# Patient Record
Sex: Female | Born: 1975 | Race: White | Hispanic: No | Marital: Married | Smoking: Former smoker
Health system: Southern US, Community
[De-identification: ages and names within clinical notes are randomized; demographics above are authoritative.]

## PROBLEM LIST (undated history)

## (undated) DIAGNOSIS — F909 Attention-deficit hyperactivity disorder, unspecified type: Secondary | ICD-10-CM

## (undated) DIAGNOSIS — T7840XA Allergy, unspecified, initial encounter: Secondary | ICD-10-CM

## (undated) DIAGNOSIS — G43909 Migraine, unspecified, not intractable, without status migrainosus: Secondary | ICD-10-CM

## (undated) DIAGNOSIS — N926 Irregular menstruation, unspecified: Secondary | ICD-10-CM

## (undated) DIAGNOSIS — K589 Irritable bowel syndrome without diarrhea: Secondary | ICD-10-CM

## (undated) DIAGNOSIS — I493 Ventricular premature depolarization: Secondary | ICD-10-CM

## (undated) HISTORY — PX: GYNECOLOGIC CRYOSURGERY: SHX857

## (undated) HISTORY — DX: Allergy, unspecified, initial encounter: T78.40XA

## (undated) HISTORY — DX: Irritable bowel syndrome, unspecified: K58.9

## (undated) HISTORY — DX: Migraine, unspecified, not intractable, without status migrainosus: G43.909

## (undated) HISTORY — PX: TUBAL LIGATION: SHX77

## (undated) HISTORY — DX: Irregular menstruation, unspecified: N92.6

## (undated) HISTORY — DX: Ventricular premature depolarization: I49.3

## (undated) HISTORY — PX: SPINE SURGERY: SHX786

## (undated) HISTORY — DX: Attention-deficit hyperactivity disorder, unspecified type: F90.9

---

## 1998-09-09 ENCOUNTER — Other Ambulatory Visit: Admission: RE | Admit: 1998-09-09 | Discharge: 1998-09-09 | Payer: Self-pay | Admitting: Obstetrics and Gynecology

## 2005-05-31 ENCOUNTER — Ambulatory Visit: Payer: Self-pay | Admitting: Family Medicine

## 2005-06-02 ENCOUNTER — Ambulatory Visit: Payer: Self-pay | Admitting: Oncology

## 2005-06-21 LAB — CBC WITH DIFFERENTIAL/PLATELET
BASO%: 0.7 % (ref 0.0–2.0)
Basophils Absolute: 0 10*3/uL (ref 0.0–0.1)
EOS%: 0.8 % (ref 0.0–7.0)
MCH: 31.4 pg (ref 26.0–34.0)
MCHC: 34.5 g/dL (ref 32.0–36.0)
MCV: 91.2 fL (ref 81.0–101.0)
MONO%: 5.6 % (ref 0.0–13.0)
RBC: 4.19 10*6/uL (ref 3.70–5.32)
RDW: 13.1 % (ref 11.3–14.5)

## 2005-06-21 LAB — MORPHOLOGY

## 2005-06-25 LAB — VON WILLEBRAND FACTOR MULTIMER
Ristocetin-Cofactor: 61 % (ref 50–150)
Von Willebrand Ag: 86 % normal (ref 60–150)
Von Willebrand Multimers: NORMAL

## 2005-06-25 LAB — ANA: Anti Nuclear Antibody(ANA): NEGATIVE

## 2005-08-15 ENCOUNTER — Ambulatory Visit: Payer: Self-pay | Admitting: Family Medicine

## 2005-09-16 ENCOUNTER — Ambulatory Visit: Payer: Self-pay | Admitting: Oncology

## 2005-09-22 ENCOUNTER — Ambulatory Visit: Payer: Self-pay | Admitting: Family Medicine

## 2005-09-28 LAB — VON WILLEBRAND PANEL
Factor-VIII Activity: 68 % — ABNORMAL LOW (ref 75–150)
Ristocetin-Cofactor: 72 % (ref 50–150)
Von Willebrand Ag: 89 % normal (ref 61–164)

## 2005-09-28 LAB — APTT: aPTT: 37 seconds (ref 24–37)

## 2005-11-15 ENCOUNTER — Ambulatory Visit: Payer: Self-pay | Admitting: Oncology

## 2006-06-20 ENCOUNTER — Ambulatory Visit: Payer: Self-pay | Admitting: Oncology

## 2006-06-20 LAB — IVY BLEEDING TIME: Bleeding Time: 5 Minutes (ref 2.0–8.0)

## 2006-06-24 LAB — VON WILLEBRAND FACTOR MULTIMER: Von Willebrand Multimers: NORMAL

## 2006-08-30 ENCOUNTER — Ambulatory Visit: Payer: Self-pay | Admitting: Oncology

## 2006-11-20 ENCOUNTER — Ambulatory Visit: Payer: Self-pay | Admitting: Family Medicine

## 2006-11-20 DIAGNOSIS — J019 Acute sinusitis, unspecified: Secondary | ICD-10-CM | POA: Insufficient documentation

## 2006-11-20 DIAGNOSIS — J45901 Unspecified asthma with (acute) exacerbation: Secondary | ICD-10-CM

## 2006-11-21 ENCOUNTER — Telehealth: Payer: Self-pay | Admitting: Family Medicine

## 2006-12-27 ENCOUNTER — Encounter: Payer: Self-pay | Admitting: Family Medicine

## 2007-03-22 DIAGNOSIS — J069 Acute upper respiratory infection, unspecified: Secondary | ICD-10-CM | POA: Insufficient documentation

## 2007-03-27 ENCOUNTER — Ambulatory Visit: Payer: Self-pay | Admitting: Family Medicine

## 2007-05-28 ENCOUNTER — Telehealth: Payer: Self-pay | Admitting: Internal Medicine

## 2007-09-03 ENCOUNTER — Ambulatory Visit: Payer: Self-pay | Admitting: Family Medicine

## 2007-09-03 DIAGNOSIS — N912 Amenorrhea, unspecified: Secondary | ICD-10-CM | POA: Insufficient documentation

## 2007-09-03 LAB — CONVERTED CEMR LAB: Beta hcg, urine, semiquantitative: NEGATIVE

## 2007-10-08 ENCOUNTER — Telehealth: Payer: Self-pay | Admitting: Family Medicine

## 2007-11-15 ENCOUNTER — Ambulatory Visit: Payer: Self-pay | Admitting: Family Medicine

## 2007-11-26 ENCOUNTER — Telehealth: Payer: Self-pay | Admitting: Family Medicine

## 2007-12-02 DIAGNOSIS — T7840XA Allergy, unspecified, initial encounter: Secondary | ICD-10-CM | POA: Insufficient documentation

## 2007-12-03 ENCOUNTER — Emergency Department (HOSPITAL_COMMUNITY): Admission: EM | Admit: 2007-12-03 | Discharge: 2007-12-03 | Payer: Self-pay | Admitting: Emergency Medicine

## 2007-12-03 ENCOUNTER — Telehealth: Payer: Self-pay | Admitting: Family Medicine

## 2007-12-03 ENCOUNTER — Ambulatory Visit: Payer: Self-pay | Admitting: Family Medicine

## 2007-12-04 ENCOUNTER — Telehealth: Payer: Self-pay | Admitting: Family Medicine

## 2007-12-06 ENCOUNTER — Telehealth: Payer: Self-pay | Admitting: Family Medicine

## 2007-12-29 ENCOUNTER — Encounter: Payer: Self-pay | Admitting: Cardiology

## 2008-03-04 ENCOUNTER — Encounter: Admission: RE | Admit: 2008-03-04 | Discharge: 2008-03-26 | Payer: Self-pay | Admitting: Obstetrics and Gynecology

## 2008-04-09 ENCOUNTER — Telehealth: Payer: Self-pay | Admitting: Family Medicine

## 2008-06-25 ENCOUNTER — Ambulatory Visit (HOSPITAL_COMMUNITY): Admission: RE | Admit: 2008-06-25 | Discharge: 2008-06-25 | Payer: Self-pay | Admitting: Obstetrics and Gynecology

## 2008-08-06 ENCOUNTER — Encounter: Payer: Self-pay | Admitting: Cardiology

## 2008-08-06 ENCOUNTER — Inpatient Hospital Stay (HOSPITAL_COMMUNITY): Admission: AD | Admit: 2008-08-06 | Discharge: 2008-08-06 | Payer: Self-pay | Admitting: Obstetrics & Gynecology

## 2008-08-06 DIAGNOSIS — I491 Atrial premature depolarization: Secondary | ICD-10-CM

## 2008-08-07 ENCOUNTER — Ambulatory Visit: Payer: Self-pay | Admitting: Family Medicine

## 2008-08-07 ENCOUNTER — Telehealth: Payer: Self-pay | Admitting: *Deleted

## 2008-08-12 ENCOUNTER — Ambulatory Visit: Payer: Self-pay | Admitting: Cardiology

## 2008-08-12 DIAGNOSIS — R002 Palpitations: Secondary | ICD-10-CM

## 2008-08-14 ENCOUNTER — Ambulatory Visit: Payer: Self-pay | Admitting: Cardiology

## 2008-08-14 ENCOUNTER — Encounter: Payer: Self-pay | Admitting: Cardiology

## 2008-08-14 ENCOUNTER — Ambulatory Visit: Payer: Self-pay

## 2008-08-20 ENCOUNTER — Telehealth: Payer: Self-pay | Admitting: Cardiology

## 2008-09-14 ENCOUNTER — Inpatient Hospital Stay (HOSPITAL_COMMUNITY): Admission: AD | Admit: 2008-09-14 | Discharge: 2008-09-16 | Payer: Self-pay | Admitting: Obstetrics and Gynecology

## 2008-11-11 ENCOUNTER — Ambulatory Visit (HOSPITAL_COMMUNITY): Admission: RE | Admit: 2008-11-11 | Discharge: 2008-11-11 | Payer: Self-pay | Admitting: Obstetrics and Gynecology

## 2008-11-11 ENCOUNTER — Encounter (INDEPENDENT_AMBULATORY_CARE_PROVIDER_SITE_OTHER): Payer: Self-pay | Admitting: Obstetrics and Gynecology

## 2008-12-29 ENCOUNTER — Telehealth: Payer: Self-pay | Admitting: Family Medicine

## 2009-01-22 ENCOUNTER — Ambulatory Visit (HOSPITAL_COMMUNITY): Admission: RE | Admit: 2009-01-22 | Discharge: 2009-01-22 | Payer: Self-pay | Admitting: Obstetrics and Gynecology

## 2009-02-02 ENCOUNTER — Telehealth: Payer: Self-pay | Admitting: Family Medicine

## 2009-02-07 ENCOUNTER — Telehealth: Payer: Self-pay | Admitting: Family Medicine

## 2009-02-09 ENCOUNTER — Ambulatory Visit: Payer: Self-pay | Admitting: Family Medicine

## 2009-02-09 LAB — CONVERTED CEMR LAB
AST: 24 units/L (ref 0–37)
Albumin: 3.7 g/dL (ref 3.5–5.2)
Alkaline Phosphatase: 56 units/L (ref 39–117)
Basophils Absolute: 0 10*3/uL (ref 0.0–0.1)
Basophils Relative: 0.7 % (ref 0.0–3.0)
Calcium: 8.9 mg/dL (ref 8.4–10.5)
Creatinine, Ser: 0.8 mg/dL (ref 0.4–1.2)
Eosinophils Absolute: 0 10*3/uL (ref 0.0–0.7)
Eosinophils Relative: 0.7 % (ref 0.0–5.0)
GFR calc non Af Amer: 87.62 mL/min (ref >60)
Glucose, Bld: 91 mg/dL (ref 70–99)
HCT: 39.1 % (ref 36.0–46.0)
HDL: 39.2 mg/dL (ref >39.00)
Leukocytes, UA: NEGATIVE
Lymphs Abs: 2.1 10*3/uL (ref 0.7–4.0)
MCV: 92.1 fL (ref 78.0–100.0)
Monocytes Relative: 9.9 % (ref 3.0–12.0)
Nitrite: NEGATIVE
Platelets: 254 10*3/uL (ref 150.0–400.0)
Total Bilirubin: 0.5 mg/dL (ref 0.3–1.2)
Total CHOL/HDL Ratio: 3
Total Protein: 7.1 g/dL (ref 6.0–8.3)
Triglycerides: 52 mg/dL (ref 0.0–149.0)
Urine Glucose: NEGATIVE mg/dL
Urobilinogen, UA: 0.2 (ref 0.0–1.0)
VLDL: 10.4 mg/dL (ref 0.0–40.0)

## 2009-02-10 ENCOUNTER — Ambulatory Visit: Payer: Self-pay | Admitting: Family Medicine

## 2009-02-10 DIAGNOSIS — R1011 Right upper quadrant pain: Secondary | ICD-10-CM

## 2009-02-11 ENCOUNTER — Encounter: Admission: RE | Admit: 2009-02-11 | Discharge: 2009-02-11 | Payer: Self-pay | Admitting: Family Medicine

## 2009-02-12 ENCOUNTER — Telehealth: Payer: Self-pay | Admitting: Family Medicine

## 2009-03-02 ENCOUNTER — Telehealth: Payer: Self-pay | Admitting: Family Medicine

## 2009-03-09 ENCOUNTER — Encounter (INDEPENDENT_AMBULATORY_CARE_PROVIDER_SITE_OTHER): Payer: Self-pay | Admitting: *Deleted

## 2009-04-14 ENCOUNTER — Telehealth: Payer: Self-pay | Admitting: Cardiology

## 2009-04-16 DIAGNOSIS — D680 Von Willebrand's disease: Secondary | ICD-10-CM | POA: Insufficient documentation

## 2009-04-22 ENCOUNTER — Ambulatory Visit: Payer: Self-pay | Admitting: Internal Medicine

## 2009-04-22 DIAGNOSIS — R109 Unspecified abdominal pain: Secondary | ICD-10-CM | POA: Insufficient documentation

## 2009-05-06 ENCOUNTER — Ambulatory Visit: Payer: Self-pay | Admitting: Internal Medicine

## 2009-05-07 ENCOUNTER — Encounter: Payer: Self-pay | Admitting: Internal Medicine

## 2009-05-08 ENCOUNTER — Telehealth: Payer: Self-pay | Admitting: Internal Medicine

## 2009-05-22 ENCOUNTER — Telehealth: Payer: Self-pay | Admitting: Internal Medicine

## 2009-11-26 ENCOUNTER — Encounter: Payer: Self-pay | Admitting: Family Medicine

## 2010-02-28 ENCOUNTER — Encounter: Payer: Self-pay | Admitting: Family Medicine

## 2010-03-11 NOTE — Progress Notes (Signed)
Summary: Hida Scan  Phone Note Call from Patient   Summary of Call: patient is calling because she spoke with the radiologist at [facility] and the HIDA scan will not work for her because she is breastfeeding.  Suggestions?  her number is [phone] Initial call taken by: Kern Reap CMA Duncan Dull),  February 12, 2009 11:00 AM  Follow-up for Phone Call        B. HIDA scan, the cannot be done because she is breast-feeding.  She wants to continue nursing.  Her ultrasound was negative and it appears symptoms have gone, since she's been on a fat-free diet.  Discussed options, general surgery consult, versus diet and waiting, and watchful follow-up.  Patient likes to watch and wait stand and died recommended.  Follow-up in one month, sooner if any problems Follow-up by: Roderick Pee MD,  February 12, 2009 11:41 AM

## 2010-03-11 NOTE — Miscellaneous (Signed)
Summary: Levsin Rx  Clinical Lists Changes  Medications: Added new medication of LEVSIN/SL 0.125 MG SUBL (HYOSCYAMINE SULFATE) Take 1 tablet under tongue before meals and at bedtime - Signed Rx of LEVSIN/SL 0.125 MG SUBL (HYOSCYAMINE SULFATE) Take 1 tablet under tongue before meals and at bedtime;  #90 x 1;  Signed;  Entered by: Jennye Boroughs RN;  Authorized by: Hart Carwin MD;  Method used: Electronically to [facility]*, [address] [place], [place]  [address], Ph: [phone] or [phone], Fax: [phone]    Prescriptions: LEVSIN/SL 0.125 MG SUBL (HYOSCYAMINE SULFATE) Take 1 tablet under tongue before meals and at bedtime  #90 x 1   Entered by:   Jennye Boroughs RN   Authorized by:   Hart Carwin MD   Signed by:   Jennye Boroughs RN on 05/06/2009   Method used:   Electronically to        [facility]* (retail)       [address] [place], [place]  [address]       Ph: [phone] or [phone]       Fax: [phone]   RxID:   [id number]

## 2010-03-11 NOTE — Assessment & Plan Note (Signed)
Summary: CPX // RS   Vital Signs:  Patient profile:   35 year old female Height:      64 inches Weight:      149 pounds Temp:     98.3 degrees F oral Pulse rate:   68 / minute Pulse rhythm:   regular BP sitting:   110 / 70  (left arm) Cuff size:   regular  Vitals Entered By: Kern Reap CMA Duncan Dull) (February 10, 2009 3:23 PM)  Reason for Visit cpx  Primary Care Provider:  Dr. Tawanna Cooler   History of Present Illness: Toni Frank is a 35 year old, married female, nonsmoker.  G3, P55, with a 57-week-old baby who comes in today for evaluation of abdominal pain.  Postpartum she began having some PC nausea with fatty foods that would last for an hour two and then went away.  Over the Christmas holiday.  She began having nausea, PC, but also severe midepigastric abdominal pain.  Her worst episode was about 10 days ago.  That episode lasted for about 3 to 4 hours.  Review of systems otherwise negative.  She had her tubes tied.  Also has an irregular heart rate every now and then.  Also, complaining of some postpartum hair loss.  Allergies: 1)  ! Sulfa  Past History:  Past medical, surgical, family and social histories (including risk factors) reviewed, and no changes noted (except as noted below).  Past Medical History: Reviewed history from 08/11/2008 and no changes required. PREMATURE ATRIAL CONTRACTIONS (ICD-427.61) ALLERGIC REACTION, ACUTE (ICD-995.3)allergic reaction to cats AMENORRHEA (ICD-626.0) VIRAL URI (ICD-465.9) SINUSITIS, ACUTE NOS (ICD-461.9) ASTHMA NOS W/ACUTE EXACERBATION (ICD-493.92) childbirth x 2 ADD   Family History: Reviewed history from 08/12/2008 and no changes required. No hx of SCD  Social History: Reviewed history from 03/27/2007 and no changes required. Occupation: Married Never Smoked Alcohol use-no Drug use-no Regular exercise-yes  Review of Systems      See HPI  Physical Exam  General:  Well-developed,well-nourished,in no acute distress;  alert,appropriate and cooperative throughout examination Head:  Normocephalic and atraumatic without obvious abnormalities. No apparent alopecia or balding. Eyes:  No corneal or conjunctival inflammation noted. EOMI. Perrla. Funduscopic exam benign, without hemorrhages, exudates or papilledema. Vision grossly normal. Ears:  External ear exam shows no significant lesions or deformities.  Otoscopic examination reveals clear canals, tympanic membranes are intact bilaterally without bulging, retraction, inflammation or discharge. Hearing is grossly normal bilaterally. Nose:  External nasal examination shows no deformity or inflammation. Nasal mucosa are pink and moist without lesions or exudates. Mouth:  Oral mucosa and oropharynx without lesions or exudates.  Teeth in good repair. Neck:  No deformities, masses, or tenderness noted. Chest Wall:  No deformities, masses, or tenderness noted. Breasts:  the breast exam is normal except for a 6-mm.  Times 6-mm lesion, 1 inch from the left nipple at the two o'clock position.  It appears to be a subcutaneous cyst Lungs:  Normal respiratory effort, chest expands symmetrically. Lungs are clear to auscultation, no crackles or wheezes. Heart:  Normal rate and regular rhythm. S1 and S2 normal without gallop, murmur, click, rub or other extra sounds. Abdomen:  Bowel sounds positive,abdomen soft and non-tender without masses, organomegaly or hernias noted. Msk:  No deformity or scoliosis noted of thoracic or lumbar spine.   Pulses:  R and L carotid,radial,femoral,dorsalis pedis and posterior tibial pulses are full and equal bilaterally Extremities:  No clubbing, cyanosis, edema, or deformity noted with normal full range of motion of all joints.  Neurologic:  No cranial nerve deficits noted. Station and gait are normal. Plantar reflexes are down-going bilaterally. DTRs are symmetrical throughout. Sensory, motor and coordinative functions appear intact. Skin:  total  body skin exam normal Cervical Nodes:  No lymphadenopathy noted Axillary Nodes:  No palpable lymphadenopathy Inguinal Nodes:  No significant adenopathy Psych:  Cognition and judgment appear intact. Alert and cooperative with normal attention span and concentration. No apparent delusions, illusions, hallucinations   Impression & Recommendations:  Problem # 1:  PALPITATIONS (ICD-785.1) Assessment Improved  Problem # 2:  ABDOMINAL PAIN RIGHT UPPER QUADRANT (ICD-789.01) Assessment: New  Orders: Radiology Referral (Radiology)  Complete Medication List: 1)  Epipen 2-pak 0.3 Mg/0.66ml (1:1000) Devi (Epinephrine hcl (anaphylaxis)) .... Uad 2)  Prenatal Vitamins  .Marland Kitchen.. 1 tab once daily  Patient Instructions: 1)  stay on a complete fat-free diet. 2)  Return two days after your ultrasound for follow-up. 3)  If in the meantime, he developed severe abdominal pain, fever, etc. go directly to the emergency room.  Check the lesion  on your left breast weekly return if that does not resolve in 4 to 6 weeks   Immunization History:  Influenza Immunization History:    Influenza:  historical (11/07/2008)

## 2010-03-11 NOTE — Letter (Signed)
Summary: Patient [facility] Biopsy Results  [facility]  [address] [place], [place] [address]   Phone: [phone]  Fax: [phone]        May 07, 2009 MRN: [id number]    [facility] [address] [place], [place]  [address]    Dear Ms. Rembert,  I am pleased to inform you that the biopsies taken during your recent endoscopic examination did not show any evidence of cancer upon pathologic examination.The biopsy from Your stomach shows normal tissue  Additional information/recommendations:  __No further action is needed at this time.  Please follow-up with      your primary care physician for your other healthcare needs.  __ Please call [phone] to schedule a return visit to review      your condition.  _x_ Continue with the treatment plan as outlined on the day of your      exam.     Please call us if you are having persistent problems or have questions about your condition that have not been fully answered at this time.  Sincerely,  Hart Carwin MD  This letter has been electronically signed by your physician.  Appended Document: Patient Notice-Endo Biopsy Results Letter mailed 4.1.11

## 2010-03-11 NOTE — Miscellaneous (Signed)
Summary: flu vaccine  Clinical Lists Changes  Observations: Added new observation of FLU VAX: Historical (11/24/2009 7:56)      Immunization History:  Influenza Immunization History:    Influenza:  historical (11/24/2009)

## 2010-03-11 NOTE — Letter (Signed)
Summary: EGD Instructions  [facility]  [address] [place], [place] [address]   Phone: [phone]  Fax: [phone]       Toni Frank    [birth year]    MRN: [id number]       Procedure Day /Date: 05/06/09 Wednesday     Arrival Time: 7:30 am     Procedure Time: 8:30 am     Location of Procedure:                    _ x _ [facility] (4th Floor)  PREPARATION FOR ENDOSCOPY   On 05/06/09 THE DAY OF THE PROCEDURE:  1.   No solid foods, milk or milk products are allowed after midnight the night before your procedure.  2.   Do not drink anything colored red or purple.  Avoid juices with pulp.  No orange juice.  3.  You may drink clear liquids until 6:30 am, which is 2 hours before your procedure.                                                                                                CLEAR LIQUIDS INCLUDE: Water Jello Ice Popsicles Tea (sugar ok, no milk/cream) Powdered fruit flavored drinks Coffee (sugar ok, no milk/cream) Gatorade Juice: apple, white grape, white cranberry  Lemonade Clear bullion, consomm, broth Carbonated beverages (any kind) Strained chicken noodle soup Hard Candy   MEDICATION INSTRUCTIONS Unless otherwise instructed, you should take regular prescription medications with a small sip of water as early as possible the morning of your procedure.                  OTHER INSTRUCTIONS  You will need a responsible adult at least 35 years of age to accompany you and drive you home.   This person must remain in the waiting room during your procedure.  Wear loose fitting clothing that is easily removed.  Leave jewelry and other valuables at home.  However, you may wish to bring a book to read or an iPod/MP3 player to listen to music as you wait for your procedure to start.  Remove all body piercing jewelry and leave at home.  Total time from sign-in until discharge is approximately 2-3 hours.  You should  go home directly after your procedure and rest.  You can resume normal activities the day after your procedure.  The day of your procedure you should not:   Drive   Make legal decisions   Operate machinery   Drink alcohol   Return to work  You will receive specific instructions about eating, activities and medications before you leave.    The above instructions have been reviewed and explained to me by [name] CMA Duncan Dull)  April 22, 2009 11:26 AM     I fully understand and can verbalize these instructions _____________________________ Date 04/22/09

## 2010-03-11 NOTE — Assessment & Plan Note (Signed)
Summary: abdominal pain--ch.   History of Present Illness Visit Type: Initial Visit Primary GI MD: Lina Sar MD Primary Provider: Dr. Tawanna Cooler Chief Complaint: Abdominal pain for 15 -20 minutes after meals x4 months History of Present Illness:   This is a 35 year old white female with subxiphoid and epigastric pain in the midline which started within 6 weeks after delivering her third child. She had no abdominal pain during pregnancy or with her 2 previous pregnancies. Her  abdominal ultrasound is negative. A HIDA scan could not be done because she is nursing. She delivered 7 months ago and the pain has been there intermittently for several months, usually starting after supper and subsiding by the time she goes to bed. There is no dysphagia. She denies taking any aspirin or anti-inflammatory agents. She does not drink alcohol. Her weight is 20 pounds below her prepregnancy weight.   GI Review of Systems    Reports abdominal pain, nausea, and  weight loss.     Location of  Abdominal pain: epigastric area. Weight loss of 20 pounds over 7 months.      Denies anal fissure, black tarry stools, change in bowel habit, constipation, diarrhea, diverticulosis, fecal incontinence, heme positive stool, hemorrhoids, irritable bowel syndrome, jaundice, light color stool, liver problems, rectal bleeding, and  rectal pain.    Current Medications (verified): 1)  Epipen 2-Pak 0.3 Mg/0.72ml (1:1000) Devi (Epinephrine Hcl (Anaphylaxis)) .... Uad 2)  Prenatal Vitamins .Marland Kitchen.. 1 Tab Once Daily  Allergies (verified): 1)  ! Sulfa  Past History:  Past Medical History: PREMATURE ATRIAL CONTRACTIONS (ICD-427.61) ALLERGIC REACTION, ACUTE (ICD-995.3)allergic reaction to cats AMENORRHEA (ICD-626.0) VIRAL URI (ICD-465.9) SINUSITIS, ACUTE NOS (ICD-461.9) ASTHMA NOS W/ACUTE EXACERBATION (ICD-493.92) childbirth x 3 ADD  Irritable Bowel Syndrome Kidney Stones  Past Surgical History: Tubal Ligation Suction,  dilatation and evacuation  Wisdom Teeth Extraction cryosurgery  Family History: Reviewed history from 04/16/2009 and no changes required. 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   Review of Systems       The patient complains of heart rhythm changes.  The patient denies allergy/sinus, anemia, anxiety-new, arthritis/joint pain, back pain, blood in urine, breast changes/lumps, change in vision, confusion, cough, coughing up blood, depression-new, fainting, fatigue, fever, headaches-new, hearing problems, heart murmur, itching, menstrual pain, muscle pains/cramps, night sweats, nosebleeds, pregnancy symptoms, shortness of breath, skin rash, sleeping problems, sore throat, swelling of feet/legs, swollen lymph glands, thirst - excessive , urination - excessive , urination changes/pain, urine leakage, vision changes, and voice change.         Pertinent positive and negative review of systems were noted in the above HPI. All other ROS was otherwise negative.   Vital Signs:  Patient profile:   35 year old female Height:      64 inches Weight:      145.13 pounds BMI:     25.00 Pulse rate:   84 / minute Pulse rhythm:   regular BP sitting:   108 / 76  (right arm) Cuff size:   regular  Vitals Entered By: June McMurray CMA Duncan Dull) (April 22, 2009 10:58 AM)  Physical Exam  General:  Well developed, well nourished, no acute distress. Eyes:  PERRLA, no icterus. Mouth:  No deformity or lesions, dentition normal. Neck:  Supple; no  masses or thyromegaly. Lungs:  Clear throughout to auscultation. Heart:  Regular rate and rhythm; no murmurs, rubs,  or bruits. Abdomen:  soft abdomen with  minimal tenderness in subxiphoid area and midline. Normoactive bowel sounds. No distention. Right upper quadrant unremarkable. Liver edge at costal margin. Extremities:  No clubbing, cyanosis, edema or deformities noted. Skin:  Intact without significant lesions or rashes. Psych:  Alert and cooperative. Normal mood and affect.   Impression & Recommendations:  Problem # 1:  ABDOMINAL PAIN RIGHT UPPER QUADRANT (ICD-789.01) Patient initially had right upper quadrant abdominal pain but now has epigastric and subxiphoid pain. She had a negative abdominal ultrasound. Her symptoms at this point are more consistent with gastritis or possible hiatal hernia. This is clearly related to food. We need to rule out peptic ulcer disease or H. pylori. The pain is intermittent and is suggestive of pancreatitis. She is otherwise healthy. She is still nursing her 4 month-old child and is planning on continuing to nurse for several more months. It would be safe for her to take her ranitidine 300 mg a day. At the same time, we will proceed with an upper endoscopy to rule out the above conditions.  Other Orders: EGD (EGD)  Patient Instructions: 1)  ranitidine 300 mg p.o. q.a.m. 2)  Upper endoscopy. 3)  She will pump prior to upper endoscopy and discard the milk after  the sedation. She will be able to nurse within 12 hours of the procedure. 4)  Copy sent to : Dr Shela Commons.Todd 5)  The medication list was reviewed and reconciled.  All changed / newly prescribed medications were explained.  A complete medication list was provided to the patient / caregiver. Prescriptions: RANITIDINE HCL 300 MG CAPS (RANITIDINE HCL) Take 1 tablet by mouth every morning  #30 x 1   Entered by:   [name] CMA (AAMA)   Authorized by:   Hart Carwin MD   Signed by:   [name] CMA (AAMA) on 04/22/2009   Method used:   Electronically to        [id number] (retail)       [address]       [place], [place]  [address]       Ph: [phone] or [phone]       Fax: [phone]   RxID:   [id number]

## 2010-03-11 NOTE — Progress Notes (Signed)
Summary: RX for abdominal pain?  Phone Note Outgoing Call   Caller: Patient Call For: Roderick Pee MD Complaint: Abdominal Pain Summary of Call: [phone] Pt wants Dr. Tawanna Cooler to know she believes her abdomen pain comes from certain foods she eats, and would like to try RX over the counter since she is still breast feeding if he would offer any suggestion.  [facility]  Initial call taken by: Lynann Beaver CMA,  March 02, 2009 2:19 PM Summary of Call: through trial and error Alexia Freestone has discovered that tomato sauce triggers her symptoms.  If she avoids anything with tomatoes   She is asymptomatic.  Advised to avoid tomatoes, and GI consult if symptoms persist Initial call taken by: Roderick Pee MD,  March 02, 2009 3:38 PM

## 2010-03-11 NOTE — Progress Notes (Signed)
Summary: irregular hr/dizzy  Phone Note Call from Patient Call back at Home Phone [phone] Call back at [phone]   Caller: Patient Reason for Call: Talk to Nurse Complaint: Chest Pain Summary of Call: before bed last night pt had irregular hr, woke up @ about 3 and she was very dizzy, still having irregular hr Initial call taken by: Migdalia Dk,  April 14, 2009 8:37 AM  Follow-up for Phone Call        Pt calling back has not heard from nurse. still having irregular heartrate Jamie Little  April 14, 2009 2:29 PM  [facility]  Scherrie Bateman, LPN  April 15, 3242 3:14 PM  Additional Follow-up for Phone Call Additional follow up Details #1::        PT RETURNING CALL.sign Additional Follow-up by: Judie Grieve,  April 14, 2009 4:14 PM    Additional Follow-up for Phone Call Additional follow up Details #2::    Reassurance that these are benign as we have told her before. Normal Echo, and reluctance to prescribe medicine with her breat feeding. Follow-up by: Gaylord Shih, MD, [facility],  April 15, 2009 10:21 AM   Appended Document: irregular hr/dizzy LMTCB./CY  Appended Document: irregular hr/dizzy PT AWARE./CY

## 2010-03-11 NOTE — Progress Notes (Signed)
Summary: TRIAGE-Side effect?  Phone Note Call from Patient Call back at Home Phone [phone] Call back at for next 1 hr [phone] cell   Call For: Dr Juanda Chance Reason for Call: Talk to Nurse Summary of Call: Thinks she has a side effect to a medicine the Dr prescribed. Initial call taken by: Leanor Kail [facility],  May 08, 2009 1:54 PM  Follow-up for Phone Call        Began Levsin Sl, c/o very dry mouth, is worried it will dry up her breast milk. Per Amy Esterwood PAC, it won't cause dehydration, suggests pt increase her fluids when she takes it. Pt. instructed to call back as needed.  Follow-up by: Laureen Ochs LPN,  May 08, 2009 3:04 PM

## 2010-03-11 NOTE — Progress Notes (Signed)
Summary: CONDITION UPDATE  Phone Note Call from Patient Call back at Home Phone [phone]   Caller: Patient Call For: Dr. Juanda Chance Reason for Call: Talk to Nurse Summary of Call: UPDATE Initial call taken by: Vallarie Mare,  May 22, 2009 11:35 AM  Follow-up for Phone Call        Last OV 04-22-09, Endo. 05-06-09.  Pt. takes Ranitadine 300mg  QAM and Levsin SL AC& HS. She has avoided foods that caused the epigastric pain.  Recently she began to attempt to add the foods back into her diet, but the pain resumes when she does.   DR.Tanganyika Bowlds PLEASE ADVISE Follow-up by: Laureen Ochs LPN,  May 22, 2009 12:29 PM  Additional Follow-up for Phone Call Additional follow up Details #1::        wait till she stops nursing, then do HIDA scan with [facility]. Also change from LevsinSL.125 mg to Bentyl 20 mg ac, #60, 1 refill. Additional Follow-up by: Hart Carwin MD,  May 23, 2009 2:00 PM    Additional Follow-up for Phone Call Additional follow up Details #2::    Message left for patient to callback. Laureen Ochs LPN  May 25, 2009 10:18 AM  Above MD orders reviewed with patient. Med to her pharmacy. She will callback to scheudle the HIDA/CCK after she stops nursing. Pt. instructed to call back as needed.  Follow-up by: Laureen Ochs LPN,  May 25, 2009 3:40 PM  New/Updated Medications: BENTYL 10 MG  CAPS (DICYCLOMINE HCL) Take 3 times daily, before meals. Prescriptions: BENTYL 10 MG  CAPS (DICYCLOMINE HCL) Take 3 times daily, before meals.  #60 x 1   Entered by:   Laureen Ochs LPN   Authorized by:   Hart Carwin MD   Signed by:   Laureen Ochs LPN on 30/86/5784   Method used:   Electronically to        [facility]* (retail)       [address] [place], [place]  [address]       Ph: [phone] or [phone]       Fax: [phone]   RxID:   [id number] RANITIDINE HCL 300 MG CAPS (RANITIDINE HCL) Take 1 tablet  by mouth every morning  #30 x 12   Entered by:   Laureen Ochs LPN   Authorized by:   Hart Carwin MD   Signed by:   Laureen Ochs LPN on 16/60/6301   Method used:   Electronically to        [facility]* (retail)       [address] [place], [place]  [address]       Ph: [phone] or [phone]       Fax: [phone]   RxID:   [id number]

## 2010-03-11 NOTE — Letter (Signed)
Summary: New Patient letter  [facility]  [address] [place], [place] [address]   Phone: [phone]  Fax: [phone]       03/09/2009 MRN: [id number]  [facility] [address] [place], [place]  [address]  Dear Ms. Jean Rosenthal,  Welcome to the Gastroenterology Division at [facility].    You are scheduled to see Dr. Juanda Chance on 04-22-09 at 10:30a.m. on the 3rd floor at [facility], [address]  We ask that you try to arrive at our office 15 minutes prior to your appointment time to allow for check-in.  We would like you to complete the enclosed self-administered evaluation form prior to your visit and bring it with you on the day of your appointment.  We will review it with you.  Also, please bring a complete list of all your medications or, if you prefer, bring the medication bottles and we will list them.  Please bring your insurance card so that we may make a copy of it.  If your insurance requires a referral to see a specialist, please bring your referral form from your primary care physician.  Co-payments are due at the time of your visit and may be paid by cash, check or credit card.     Your office visit will consist of a consult with your physician (includes a physical exam), any laboratory testing he/she may order, scheduling of any necessary diagnostic testing (e.g. x-ray, ultrasound, CT-scan), and scheduling of a procedure (e.g. Endoscopy, Colonoscopy) if required.  Please allow enough time on your schedule to allow for any/all of these possibilities.    If you cannot keep your appointment, please call [phone] to cancel or reschedule prior to your appointment date.  This allows [place] the opportunity to schedule an appointment for another patient in need of care.  If you do not cancel or reschedule by 5 p.m. the business day prior to your appointment date, you will be charged a $50.00 late cancellation/no-show fee.    Thank you for  choosing [facility] for your medical needs.  We appreciate the opportunity to care for you.  Please visit [place] at our website  to learn more about our practice.                     Sincerely,                                                             The Gastroenterology Division

## 2010-03-11 NOTE — Procedures (Signed)
Summary: Upper Endoscopy  Patient: Toni Frank Note: All result statuses are Final unless otherwise noted.  Tests: (1) Upper Endoscopy (EGD)   EGD Upper Endoscopy       DONE     [facility]     [address]     [place], [place]  [address]           ENDOSCOPY PROCEDURE REPORT           PATIENT:  Toni Frank, Service  MR#:  [id number]     BIRTHDATE:  [birth year], 33 yrs. old  GENDER:  female           ENDOSCOPIST:  Hedwig Morton. Juanda Chance, MD     Referred by:  Eugenio Hoes Tawanna Cooler, M.D.           PROCEDURE DATE:  05/06/2009     PROCEDURE:  EGD with biopsy     ASA CLASS:  Class I     INDICATIONS:  abdominal pain normal abd. ultrasound     pt is nursing, cannot have a HIDA scan     Zantac 300mg  no effect, pain worse with meals and stress           MEDICATIONS:   Versed 8 mg, Fentanyl 75 mcg     TOPICAL ANESTHETIC:  Exactacain Spray           DESCRIPTION OF PROCEDURE:   After the risks benefits and     alternatives of the procedure were thoroughly explained, informed     consent was obtained.  The LB GIF-H180 [id number] endoscope was     introduced through the mouth and advanced to the second portion of     the duodenum, without limitations.  The instrument was slowly     withdrawn as the mucosa was fully examined.     <<PROCEDUREIMAGES>>           The upper, middle, and distal third of the esophagus were     carefully inspected and no abnormalities were noted. The z-line     was well seen at the GEJ. The endoscope was pushed into the fundus     which was normal including a retroflexed view. The antrum,gastric     body, first and second part of the duodenum were unremarkable.  A     biopsy for H. pylori was taken (see image1, image2, image3,     image4, image5, and image6).    Retroflexed views revealed no     abnormalities.    The scope was then withdrawn from the patient     and the procedure completed.           COMPLICATIONS:  None           ENDOSCOPIC IMPRESSION:     1)  Normal EGD     nothing to account for the abd. pain     RECOMMENDATIONS:     1) Await pathology results     Levsin SL.125 mg ac and hs prn abd. pain, # 90, 1 refill           REPEAT EXAM:  In 0 year(s) for.           ______________________________     Hedwig Morton. Juanda Chance, MD           CC:           n.     eSIGNED:   Hedwig Morton. Yolande Skoda at 05/06/2009 09:03 AM  Mihaela, [place], [id number]  Note: An exclamation mark (!) indicates a result that was not dispersed into the flowsheet. Document Creation Date: 05/06/2009 9:04 AM _______________________________________________________________________  (1) Order result status: Final Collection or observation date-time: 05/06/2009 08:52 Requested date-time:  Receipt date-time:  Reported date-time:  Referring Physician:   Ordering Physician: Lina Sar [phone]) Specimen Source:  Source: Launa Grill Order Number: [id number] Lab site:

## 2010-03-11 NOTE — Progress Notes (Signed)
  Phone Note Call from Patient Call back at [facility] Phone [phone]   Caller: Patient Summary of Call: Several week hx of postprandial nausea and upper abdominal midepigastric "cramping."  Denies fever, vomiting, or diarrhea.  Has tried bland diet without improvement.  scheduled for labs Monday and CPE with Dr Tawanna Cooler next Friday.  Hx BTL.  No classic reflux symptoms.  Appetite is good.  Pt will try OTC antacid and f/u with Dr Tawanna Cooler prior to next Friday if she has any fever or progressive sxs. ?gallstones Initial call taken by: Evelena Peat MD,  February 07, 2009 9:09 AM     Appended Document:  dr todd would like for this patient to come in today for a cpx 12:30 today if possible  Appended Document:  Pt was contacted and scheduled for CPX with Dr Tawanna Cooler.... Pt now has appt tomorrow (02/11/2008) at 3:15pm for her CPX.

## 2010-05-10 LAB — CBC
HCT: 40.9 % (ref 36.0–46.0)
MCV: 91.8 fL (ref 78.0–100.0)
Platelets: 292 10*3/uL (ref 150–400)
RDW: 12.5 % (ref 11.5–15.5)

## 2010-05-10 LAB — PREGNANCY, URINE: Preg Test, Ur: NEGATIVE

## 2010-05-13 LAB — URINALYSIS, ROUTINE W REFLEX MICROSCOPIC
Bilirubin Urine: NEGATIVE
Ketones, ur: NEGATIVE mg/dL

## 2010-05-13 LAB — CBC
HCT: 38.7 % (ref 36.0–46.0)
Hemoglobin: 13.1 g/dL (ref 12.0–15.0)
MCHC: 33.9 g/dL (ref 30.0–36.0)
RBC: 4.06 MIL/uL (ref 3.87–5.11)
WBC: 6.3 10*3/uL (ref 4.0–10.5)

## 2010-05-13 LAB — URINE MICROSCOPIC-ADD ON

## 2010-05-13 LAB — PREGNANCY, URINE: Preg Test, Ur: NEGATIVE

## 2010-05-15 LAB — CBC
HCT: 31.7 % — ABNORMAL LOW (ref 36.0–46.0)
Hemoglobin: 11 g/dL — ABNORMAL LOW (ref 12.0–15.0)
MCV: 98.6 fL (ref 78.0–100.0)
RBC: 3.22 MIL/uL — ABNORMAL LOW (ref 3.87–5.11)
WBC: 19.8 10*3/uL — ABNORMAL HIGH (ref 4.0–10.5)

## 2010-05-17 LAB — URINALYSIS, ROUTINE W REFLEX MICROSCOPIC
Glucose, UA: NEGATIVE mg/dL
Ketones, ur: 15 mg/dL — AB
Protein, ur: NEGATIVE mg/dL
Specific Gravity, Urine: <1.005 — ABNORMAL LOW (ref 1.005–1.030)
Urobilinogen, UA: 0.2 mg/dL (ref 0.0–1.0)
pH: 6 (ref 5.0–8.0)

## 2010-05-17 LAB — DIFFERENTIAL
Basophils Relative: 0 % (ref 0–1)
Eosinophils Relative: 1 % (ref 0–5)
Lymphocytes Relative: 21 % (ref 12–46)
Lymphs Abs: 2 10*3/uL (ref 0.7–4.0)
Monocytes Absolute: 0.7 10*3/uL (ref 0.1–1.0)
Neutrophils Relative %: 71 % (ref 43–77)

## 2010-05-17 LAB — COMPREHENSIVE METABOLIC PANEL
BUN: 7 mg/dL (ref 6–23)
CO2: 25 mEq/L (ref 19–32)
Calcium: 9.5 mg/dL (ref 8.4–10.5)
Chloride: 105 mEq/L (ref 96–112)
Creatinine, Ser: 0.58 mg/dL (ref 0.4–1.2)
GFR calc Af Amer: >60 mL/min (ref >60)
Glucose, Bld: 89 mg/dL (ref 70–99)
Potassium: 4.1 mEq/L (ref 3.5–5.1)
Sodium: 138 mEq/L (ref 135–145)

## 2010-05-17 LAB — CBC
MCHC: 35 g/dL (ref 30.0–36.0)
Platelets: 240 10*3/uL (ref 150–400)

## 2010-06-03 ENCOUNTER — Other Ambulatory Visit (INDEPENDENT_AMBULATORY_CARE_PROVIDER_SITE_OTHER): Payer: BC Managed Care – PPO | Admitting: Family Medicine

## 2010-06-03 DIAGNOSIS — Z0000 Encounter for general adult medical examination without abnormal findings: Secondary | ICD-10-CM

## 2010-06-03 LAB — LIPID PANEL
HDL: 48.7 mg/dL (ref >39.00)
LDL Cholesterol: 94 mg/dL (ref 0–99)

## 2010-06-03 LAB — POCT URINALYSIS DIPSTICK
Ketones, UA: NEGATIVE
Nitrite, UA: NEGATIVE
Protein, UA: NEGATIVE
Spec Grav, UA: 1.015
Urobilinogen, UA: 0.2
pH, UA: 5.5

## 2010-06-03 LAB — CBC WITH DIFFERENTIAL/PLATELET
Basophils Absolute: 0 10*3/uL (ref 0.0–0.1)
Lymphocytes Relative: 26.5 % (ref 12.0–46.0)
MCHC: 34.2 g/dL (ref 30.0–36.0)
Monocytes Absolute: 0.8 10*3/uL (ref 0.1–1.0)
Monocytes Relative: 8.6 % (ref 3.0–12.0)
Neutro Abs: 5.9 10*3/uL (ref 1.4–7.7)
RBC: 3.99 Mil/uL (ref 3.87–5.11)
WBC: 9.2 10*3/uL (ref 4.5–10.5)

## 2010-06-03 LAB — BASIC METABOLIC PANEL
Calcium: 9.2 mg/dL (ref 8.4–10.5)
Chloride: 104 mEq/L (ref 96–112)
Creatinine, Ser: 0.8 mg/dL (ref 0.4–1.2)
GFR: 90.85 mL/min (ref >60.00)
Glucose, Bld: 89 mg/dL (ref 70–99)
Potassium: 4.8 mEq/L (ref 3.5–5.1)

## 2010-06-03 LAB — HEPATIC FUNCTION PANEL
AST: 17 U/L (ref 0–37)
Bilirubin, Direct: 0.1 mg/dL (ref 0.0–0.3)
Total Protein: 7.4 g/dL (ref 6.0–8.3)

## 2010-06-03 LAB — TSH: TSH: 0.84 u[IU]/mL (ref 0.35–5.50)

## 2010-06-10 ENCOUNTER — Encounter: Payer: Self-pay | Admitting: Family Medicine

## 2010-06-14 ENCOUNTER — Encounter: Payer: Self-pay | Admitting: Family Medicine

## 2010-06-14 ENCOUNTER — Ambulatory Visit (INDEPENDENT_AMBULATORY_CARE_PROVIDER_SITE_OTHER): Payer: BC Managed Care – PPO | Admitting: Family Medicine

## 2010-06-14 DIAGNOSIS — M5116 Intervertebral disc disorders with radiculopathy, lumbar region: Secondary | ICD-10-CM | POA: Insufficient documentation

## 2010-06-14 DIAGNOSIS — L259 Unspecified contact dermatitis, unspecified cause: Secondary | ICD-10-CM

## 2010-06-14 DIAGNOSIS — Z23 Encounter for immunization: Secondary | ICD-10-CM

## 2010-06-14 DIAGNOSIS — L708 Other acne: Secondary | ICD-10-CM

## 2010-06-14 DIAGNOSIS — M5126 Other intervertebral disc displacement, lumbar region: Secondary | ICD-10-CM

## 2010-06-14 MED ORDER — CEPHALEXIN 500 MG PO CAPS: ORAL_CAPSULE | ORAL | Status: DC

## 2010-06-14 NOTE — Progress Notes (Signed)
  Subjective:    Patient ID: Toni Frank, female    DOB: Jul 05, 1975, 35 y.o.   MRN: [id number]  HPI  Toni Frank is a 35 year old, married female, G3, P3,,,,,,,,,, 3 boys,,,,,,, comes in today for general physical examination and to discuss her back pain problem and a skin rash.  Sensory saw her last she developed severe back pain.  She had a number of diagnostic studies.  She does have an L3-L4 ruptured disk.  She's tried physical therapy, and has been to neurosurgery.  [place] and is considering a second opinion in [place].  The pain is intermittent, for which he takes Vicodin occasionally, however, does radiate down her left leg and causes numbness in her left foot.  Also, a week before her period or skin breaks out with acne.  She gets routine eye care, dental care, BSE monthly, LMP 430, normal, BTL,    Review of Systems  Constitutional: Negative.   HENT: Negative.   Eyes: Negative.   Respiratory: Negative.   Cardiovascular: Negative.   Gastrointestinal: Negative.   Genitourinary: Negative.   Musculoskeletal: Positive for back pain.  Skin: Positive for rash.  Neurological: Positive for numbness.  Hematological: Negative.   Psychiatric/Behavioral: Negative.        Objective:   Physical Exam  Constitutional: She appears well-developed and well-nourished.  HENT:  Head: Normocephalic and atraumatic.  Right Ear: External ear normal.  Left Ear: External ear normal.  Nose: Nose normal.  Mouth/Throat: Oropharynx is clear and moist.  Eyes: EOM are normal. Pupils are equal, round, and reactive to light.  Neck: Normal range of motion. Neck supple. No thyromegaly present.  Cardiovascular: Normal rate, regular rhythm, normal heart sounds and intact distal pulses.  Exam reveals no gallop and no friction rub.   No murmur heard. Pulmonary/Chest: Effort normal and breath sounds normal.  Abdominal: Soft. Bowel sounds are normal. She exhibits no distension and no mass.  There is no tenderness. There is no rebound.  Genitourinary:       Bilateral breast exam normal  Musculoskeletal: Normal range of motion.  Lymphadenopathy:    She has no cervical adenopathy.  Neurological: She is alert. She has normal reflexes. No cranial nerve deficit. She exhibits normal muscle tone. Coordination normal.  Skin: Skin is warm and dry.  Psychiatric: She has a normal mood and affect. Her behavior is normal. Judgment and thought content normal.          Assessment & Plan:  Healthy female.  Lumbar disk disease,,,,,,,,, follow-up by neurosurgery.  Adult acne,,,,,,,,,,, Keflex two tabs b.i.d., p.r.n.

## 2010-06-14 NOTE — Patient Instructions (Signed)
Keflex two tabs twice daily for acne breakout.  Return one year or sooner if any problems

## 2010-06-22 NOTE — H&P (Signed)
NAME:  Toni Frank, Toni Frank NO.:  [id number]   MEDICAL RECORD NO.:  [id number]          PATIENT TYPE:  INP   LOCATION:  9104                          FACILITY:  [facility]   PHYSICIAN:  Janine Limbo, M.D.DATE OF BIRTH:  [birth year]   DATE OF ADMISSION:  09/14/2008  DATE OF DISCHARGE:                              HISTORY & PHYSICAL   ADMIT NOTE:  A 35 year old gravida 3, para 2, last menstrual period on  December 02, 2007, equals an EDD of September 18, 2008, presents to the [facility]  for evaluation of labor, complaining of contractions since 4:30 a.m.   ADMISSION DIAGNOSES:  Intrauterine pregnancy at 41 weeks, early labor,  history of abnormal Pap test with a cryosurgery, history of kidney  stones, and mild von Willebrand disease.   HISTORY OF PRESENT ILLNESS:  Began prenatal care at [facility], starting on January 30, 2008, at 11 and 4/7 weeks.  She had a  history of some back and hip pain during the pregnancy, relieved by  Tylenol.  On April 15, 2008, she was seen and treated for strep throat  infection with penicillin.  On ultrasound, Jun 12, 2008, there was a  followup of pyelectasis of the baby and polyhydramnios.  On Jun 25, 2008, an appointment with Maternal Fetal Medicine was done and the  pyelectasis was resolved.  AFI was 26.4 and cervical length was 3.63.  No other problems noted on the chart.   LABORATORY DATA:  She is O positive, Rh antibody negative, VDRL  negative, HBsAg negative, HIV nonreactive, Pap within normal limits, GC  and Chlamydia negative, and GBS was negative.   PAST MEDICAL HISTORY:  She is allergic to SULFA, it causes hives.  The  patient has mild form of von Willebrand disease, easily bruises.  She  has had a history of kidney stones x3.   PAST SURGICAL HISTORY:  She had colpo with cryosurgery in 1996.  As a  child she had ankle, knees, and wrist surgery, and wisdom teeth in 1995.   PAST OB HISTORY AND GYN HISTORY:  Her menses  started at 12, every 28  days, and lasted 4-6 days.  Her pregnancy test was positive on December 29, 2007.  First baby in 2005.  She had induction of labor at 41-1/2  weeks, epidural anesthesia of a viable female infant weighing 7 pounds  after 12 hours of labor.  In 2006, she had spontaneous vaginal delivery  of a viable female infant at 40 weeks with 28 hours of labor, name Genevie Cheshire,  present pregnancy.   SOCIAL HISTORY:  She is married to [place], who is a controller in a  firm.  She is a Futures trader.  She did quit smoking in 2004.  Denies  alcohol or drugs.   FAMILY HISTORY:  Her mother has heart disease and so does her maternal  grandfather and maternal grandmother.  Hypertension in her maternal  grandfather.  Her sister has hyperthyroidism.  Cancer, her paternal  grandfather has liver and lung cancer.  Father of the baby's brother has  twins.   REVIEW OF SYSTEMS:  Denies headaches and epigastric pain.  Does complain  of contractions.  HEART:  Regular rate without murmur.  LUNGS:  Clear  bilaterally.  ABDOMEN:  Gravid.  Estimated fetal weight 7-1/2 to 8  pounds, contractions per monitor every 6 minutes.  Fetal heart tone 140-  150.  No decelerations noted.  Vaginal exam 4 cm, -1 station, vertex  intact, bag of water, and negative Homans.   ASSESSMENT:  Intrauterine pregnancy at 41 weeks, early labor, mild von  Willebrand's.   PLAN:  Admit to Labor and Delivery, wants to have a water birth, did  bring her birthing tub.  She is to ambulate, hydrate with p.o. fluids,  medicate as needed per patient request, consult as needed with Dr.  Stefano Gaul with doctor on-call, and anticipate birth.      Jasmine Awe, CNM      Janine Limbo, M.D.  Electronically Signed    JM/MEDQ  D:  09/14/2008  T:  09/14/2008  Job:  [id number]

## 2010-07-06 ENCOUNTER — Telehealth: Payer: Self-pay | Admitting: *Deleted

## 2010-07-06 NOTE — Telephone Encounter (Signed)
Pt. Has decided to go on the BCP that she only has 3 periods per year.  Would like a 90 day supply called to [employer].

## 2010-07-07 MED ORDER — LEVONORGEST-ETH ESTRAD 91-DAY 0.15-0.03 MG PO TABS: 1.0000 | ORAL_TABLET | Freq: Every day | ORAL | Status: DC

## 2010-07-07 NOTE — Telephone Encounter (Signed)
Prescribed these seasonal BCPs,,,,,,,,,,, 90 tabs per pack,,,,,,,,,, directions one daily for 90 days,,,,,,,,, and then recycle, dispense 4 packs

## 2010-07-21 ENCOUNTER — Telehealth: Payer: Self-pay | Admitting: *Deleted

## 2010-07-21 NOTE — Telephone Encounter (Signed)
Has been diagnosed with Cushing Disease and would like to talk to Dr. Tawanna Cooler about it.

## 2010-07-26 NOTE — Telephone Encounter (Signed)
Will need an office visit and show need to bring all her lab work and documentation with her so I can review the results

## 2010-07-27 NOTE — Telephone Encounter (Signed)
Left message on machine for patient

## 2010-10-13 ENCOUNTER — Encounter (HOSPITAL_COMMUNITY)
Admission: RE | Admit: 2010-10-13 | Discharge: 2010-10-13 | Disposition: A | Discharge status: Home / Self Care | Payer: BC Managed Care – PPO | Source: Ambulatory Visit | Attending: Neurosurgery | Admitting: Neurosurgery

## 2010-10-13 DIAGNOSIS — Z01812 Encounter for preprocedural laboratory examination: Secondary | ICD-10-CM | POA: Insufficient documentation

## 2010-10-13 DIAGNOSIS — Z01818 Encounter for other preprocedural examination: Secondary | ICD-10-CM | POA: Insufficient documentation

## 2010-10-13 LAB — CBC
HCT: 40.1 % (ref 36.0–46.0)
Hemoglobin: 13.4 g/dL (ref 12.0–15.0)
MCH: 30.9 pg (ref 26.0–34.0)
MCHC: 33.4 g/dL (ref 30.0–36.0)
MCV: 92.6 fL (ref 78.0–100.0)
Platelets: 326 10*3/uL (ref 150–400)
RBC: 4.33 MIL/uL (ref 3.87–5.11)
RDW: 12.5 % (ref 11.5–15.5)
WBC: 7.6 10*3/uL (ref 4.0–10.5)

## 2010-10-13 LAB — SURGICAL PCR SCREEN
MRSA, PCR: POSITIVE — AB
Staphylococcus aureus: POSITIVE — AB

## 2010-10-13 LAB — HCG, SERUM, QUALITATIVE: Preg, Serum: NEGATIVE

## 2010-10-20 ENCOUNTER — Ambulatory Visit: Payer: BC Managed Care – PPO | Attending: Neurosurgery | Admitting: Physical Therapy

## 2010-10-20 DIAGNOSIS — M545 Low back pain, unspecified: Secondary | ICD-10-CM | POA: Insufficient documentation

## 2010-10-20 DIAGNOSIS — IMO0001 Reserved for inherently not codable concepts without codable children: Secondary | ICD-10-CM | POA: Insufficient documentation

## 2010-10-21 ENCOUNTER — Inpatient Hospital Stay (HOSPITAL_COMMUNITY): Admission: RE | Admit: 2010-10-21 | Payer: BC Managed Care – PPO | Source: Ambulatory Visit | Admitting: Neurosurgery

## 2010-10-25 ENCOUNTER — Ambulatory Visit: Payer: BC Managed Care – PPO | Admitting: Physical Therapy

## 2010-10-26 ENCOUNTER — Ambulatory Visit: Payer: BC Managed Care – PPO | Admitting: Physical Therapy

## 2010-11-01 ENCOUNTER — Ambulatory Visit: Payer: BC Managed Care – PPO | Admitting: Physical Therapy

## 2010-11-08 ENCOUNTER — Ambulatory Visit: Payer: BC Managed Care – PPO | Attending: Neurosurgery | Admitting: Physical Therapy

## 2010-11-08 DIAGNOSIS — M545 Low back pain, unspecified: Secondary | ICD-10-CM | POA: Insufficient documentation

## 2010-11-08 DIAGNOSIS — IMO0001 Reserved for inherently not codable concepts without codable children: Secondary | ICD-10-CM | POA: Insufficient documentation

## 2010-11-17 ENCOUNTER — Encounter: Payer: BC Managed Care – PPO | Admitting: Physical Therapy

## 2010-12-01 ENCOUNTER — Encounter (HOSPITAL_COMMUNITY)
Admission: RE | Admit: 2010-12-01 | Discharge: 2010-12-01 | Disposition: A | Discharge status: Home / Self Care | Payer: BC Managed Care – PPO | Source: Ambulatory Visit | Attending: Neurosurgery | Admitting: Neurosurgery

## 2010-12-01 LAB — CBC
HCT: 38.2 % (ref 36.0–46.0)
Hemoglobin: 13.2 g/dL (ref 12.0–15.0)
MCH: 31.4 pg (ref 26.0–34.0)
MCHC: 34.6 g/dL (ref 30.0–36.0)
MCV: 90.7 fL (ref 78.0–100.0)
Platelets: 258 10*3/uL (ref 150–400)
RBC: 4.21 MIL/uL (ref 3.87–5.11)
RDW: 12.1 % (ref 11.5–15.5)
WBC: 11 10*3/uL — ABNORMAL HIGH (ref 4.0–10.5)

## 2010-12-01 LAB — TYPE AND SCREEN
ABO/RH(D): O POS
Antibody Screen: NEGATIVE

## 2010-12-01 LAB — HCG, SERUM, QUALITATIVE: Preg, Serum: NEGATIVE

## 2010-12-01 LAB — SURGICAL PCR SCREEN
MRSA, PCR: POSITIVE — AB
Staphylococcus aureus: POSITIVE — AB

## 2010-12-01 LAB — ABO/RH: ABO/RH(D): O POS

## 2010-12-09 ENCOUNTER — Telehealth: Payer: Self-pay | Admitting: Family Medicine

## 2010-12-09 ENCOUNTER — Inpatient Hospital Stay (HOSPITAL_COMMUNITY)
Admission: RE | Admit: 2010-12-09 | Discharge: 2010-12-13 | DRG: 756 | Disposition: A | Discharge status: Home Health | Payer: BC Managed Care – PPO | Source: Ambulatory Visit | Attending: Neurosurgery | Admitting: Neurosurgery

## 2010-12-09 ENCOUNTER — Inpatient Hospital Stay (HOSPITAL_COMMUNITY): Payer: BC Managed Care – PPO

## 2010-12-09 DIAGNOSIS — M5126 Other intervertebral disc displacement, lumbar region: Principal | ICD-10-CM | POA: Diagnosis present

## 2010-12-09 DIAGNOSIS — Z882 Allergy status to sulfonamides status: Secondary | ICD-10-CM

## 2010-12-09 DIAGNOSIS — Z87891 Personal history of nicotine dependence: Secondary | ICD-10-CM

## 2010-12-09 DIAGNOSIS — Z981 Arthrodesis status: Secondary | ICD-10-CM

## 2010-12-09 DIAGNOSIS — M47817 Spondylosis without myelopathy or radiculopathy, lumbosacral region: Secondary | ICD-10-CM | POA: Diagnosis present

## 2010-12-09 DIAGNOSIS — M51379 Other intervertebral disc degeneration, lumbosacral region without mention of lumbar back pain or lower extremity pain: Secondary | ICD-10-CM | POA: Diagnosis present

## 2010-12-09 DIAGNOSIS — M5137 Other intervertebral disc degeneration, lumbosacral region: Secondary | ICD-10-CM | POA: Diagnosis present

## 2010-12-09 NOTE — Telephone Encounter (Signed)
Pt is having [facility] surgery at [facility] today. FYI

## 2010-12-10 MED ORDER — HYDROXYZINE HCL 50 MG/ML IM SOLN
50.0000 mg | INTRAMUSCULAR | Status: DC | PRN
  Filled 2010-12-10: qty 1

## 2010-12-10 MED ORDER — OXYCODONE-ACETAMINOPHEN 5-325 MG PO TABS: 1.0000 | ORAL_TABLET | Freq: Four times a day (QID) | ORAL | Status: DC | PRN

## 2010-12-10 MED ORDER — DROSPIREN-ETH ESTRAD-LEVOMEFOL 3-0.03-0.451 MG PO TABS
1.0000 | ORAL_TABLET | Freq: Every day | ORAL | Status: DC
  Administered 2010-12-12 – 2010-12-13 (×2): 1 via ORAL

## 2010-12-10 MED ORDER — AMOXICILLIN 500 MG PO CAPS
500.0000 mg | ORAL_CAPSULE | Freq: Three times a day (TID) | ORAL | Status: AC
  Administered 2010-12-12 (×3): 500 mg via ORAL
  Filled 2010-12-10 (×8): qty 1

## 2010-12-10 MED ORDER — OXYCODONE HCL 5 MG PO TABS: 5.0000 mg | ORAL_TABLET | Freq: Four times a day (QID) | ORAL | Status: DC | PRN

## 2010-12-10 MED ORDER — SENNOSIDES-DOCUSATE SODIUM 8.6-50 MG PO TABS: 1.0000 | ORAL_TABLET | ORAL | Status: DC | PRN

## 2010-12-10 MED ORDER — HYDROCODONE-ACETAMINOPHEN 5-325 MG PO TABS
1.0000 | ORAL_TABLET | ORAL | Status: DC | PRN
  Filled 2010-12-10: qty 2

## 2010-12-10 MED ORDER — ZOLPIDEM TARTRATE 10 MG PO TABS: 10.0000 mg | ORAL_TABLET | Freq: Every evening | ORAL | Status: DC | PRN

## 2010-12-10 MED ORDER — KCL IN DEXTROSE-NACL 20-5-0.45 MEQ/L-%-% IV SOLN
INTRAVENOUS | Status: DC
  Filled 2010-12-10 (×6): qty 1000

## 2010-12-10 MED ORDER — MORPHINE SULFATE 4 MG/ML IJ SOLN: 4.0000 mg | INTRAMUSCULAR | Status: DC | PRN

## 2010-12-10 MED ORDER — MENTHOL 3 MG MT LOZG: 1.0000 | LOZENGE | OROMUCOSAL | Status: DC | PRN

## 2010-12-10 MED ORDER — CYCLOBENZAPRINE HCL 10 MG PO TABS
10.0000 mg | ORAL_TABLET | Freq: Three times a day (TID) | ORAL | Status: DC | PRN
  Administered 2010-12-12: 10 mg via ORAL

## 2010-12-10 MED ORDER — SODIUM CHLORIDE 0.9 % IJ SOLN
3.0000 mL | Freq: Two times a day (BID) | INTRAMUSCULAR | Status: DC
Start: 2010-12-10 — End: 2010-12-13
  Administered 2010-12-12 – 2010-12-13 (×3): 3 mL via INTRAVENOUS

## 2010-12-10 MED ORDER — HYDROXYZINE HCL 50 MG PO TABS
50.0000 mg | ORAL_TABLET | ORAL | Status: DC | PRN
  Filled 2010-12-10: qty 1

## 2010-12-10 MED ORDER — DIAZEPAM 5 MG PO TABS
5.0000 mg | ORAL_TABLET | Freq: Four times a day (QID) | ORAL | Status: DC | PRN
  Administered 2010-12-12 – 2010-12-13 (×4): 5 mg via ORAL
  Filled 2010-12-10 (×4): qty 1

## 2010-12-11 MED ORDER — OXYCODONE HCL 5 MG PO TABS
5.0000 mg | ORAL_TABLET | Freq: Four times a day (QID) | ORAL | Status: DC | PRN
Start: 2010-12-11 — End: 2010-12-13
  Administered 2010-12-12 (×2): 10 mg via ORAL
  Administered 2010-12-13: 5 mg via ORAL
  Filled 2010-12-11 (×2): qty 2

## 2010-12-11 MED ORDER — OXYCODONE-ACETAMINOPHEN 5-325 MG PO TABS
1.0000 | ORAL_TABLET | Freq: Four times a day (QID) | ORAL | Status: DC | PRN
  Administered 2010-12-12 (×2): 2 via ORAL
  Administered 2010-12-12 (×2): 1 via ORAL
  Administered 2010-12-13: 2 via ORAL
  Administered 2010-12-13: 1 via ORAL
  Filled 2010-12-11 (×2): qty 2
  Filled 2010-12-11: qty 1

## 2010-12-12 MED ORDER — MAGNESIUM HYDROXIDE 400 MG/5ML PO SUSP
30.0000 mL | Freq: Every day | ORAL | Status: DC | PRN
  Administered 2010-12-12 – 2010-12-13 (×2): 30 mL via ORAL
  Filled 2010-12-12 (×2): qty 30

## 2010-12-12 MED ORDER — FLEET ENEMA 7-19 GM/118ML RE ENEM
1.0000 | ENEMA | Freq: Every day | RECTAL | Status: DC | PRN
  Filled 2010-12-12: qty 1

## 2010-12-12 NOTE — Progress Notes (Signed)
Subjective: Patient reports constipation, but legs better  Objective: Vital signs in last 24 hours: Temp:  [98.2 F (36.8 C)-99.6 F (37.6 C)] 98.2 F (36.8 C) (11/04 1445) Pulse Rate:  [82-93] 89  (11/04 1445) Resp:  [17-20] 18  (11/04 1445) BP: (107-118)/(70-79) 114/77 mmHg (11/04 1445) SpO2:  [96 %-97 %] 97 % (11/04 1445)  Intake/Output from previous day:   Intake/Output this shift:    Physical Exam: Full strength in both legs. Wearing brace and ambulating.  Lab Results: No results found for this basename: WBC:2,HGB:2,HCT:2,PLT:2 in the last 72 hours BMET No results found for this basename: NA:2,K:2,CL:2,CO2:2,GLUCOSE:2,BUN:2,CREATININE:2,CALCIUM:2 in the last 72 hours  Studies/Results: No results found.  Assessment/Plan: Enema. Continue ambulation.    LOS: 3 days    Dorian Heckle, MD 12/12/2010, 5:32 PM

## 2010-12-13 MED ORDER — MAGNESIUM HYDROXIDE 400 MG/5ML PO SUSP: 30.0000 mL | Freq: Every day | ORAL | Status: AC | PRN

## 2010-12-13 MED ORDER — CYCLOBENZAPRINE HCL 10 MG PO TABS: 10.0000 mg | ORAL_TABLET | Freq: Three times a day (TID) | ORAL | Status: AC | PRN

## 2010-12-13 MED ORDER — OXYCODONE-ACETAMINOPHEN 5-325 MG PO TABS: 1.0000 | ORAL_TABLET | ORAL | Status: AC | PRN

## 2010-12-13 NOTE — Discharge Summary (Signed)
NAME:  Toni Frank, Toni Frank NO.:  [id number]  MEDICAL RECORD NO.:  [id number]  LOCATION:  3025                         FACILITY:  [facility]  PHYSICIAN:  Hewitt Shorts, M.D.DATE OF BIRTH:  Jun 23, 1975  DATE OF ADMISSION:  12/09/2010 DATE OF DISCHARGE:  12/13/2010                              DISCHARGE SUMMARY   ADMISSION HISTORY AND PHYSICAL EXAMINATION:  The patient is a 35 year old woman who presented with low back and bilateral extremity pain.  She is found to have a large L3-4 disk herniation and a decision was made to proceed with decompression and stabilization.  Details of past medical history, family history, social history, review of systems included in admission note.  General examination was unremarkable.  Neurologic examination showed intact strength and sensation.  HOSPITAL COURSE:  The patient was admitted, underwent surgical decompression and stabilization.  She was found intraoperatively to have bilateral L3 pars interarticularis defect and therefore an L3 Gill procedure was performed as part of the decompression and then a L3-4 post lumbar interbody fusion and posterolateral arthrodesis were performed.  Postoperatively, the patient has done well.  She has been able to gradually mobilize.  Her wound is healed nicely.  She has been afebrile. She did have difficulties with constipation that were initially treated with milk of magnesia and then fleets Enema and finally had a response following the fleets enema.  She has been able to progress from walking with a walker to now walking on her own in the halls.  We made arrangements for a 3-in-1 to be delivered to the home so that she desires to lower herself down to a low commode.  She has been given a prescription for Percocet 5/325 one or two tablets p.o. q.4-6 hours p.r.n. pain, 75 tablets, no refills and Flexeril 10 mg t.i.d. p.r.n. muscle spasms, 90 tablets and 1 refill.  She has also been advised  to use milk of magnesia on an as-needed basis.  She has been instructed regarding wound care and activities.  She is to return to my office for followup in 3 weeks or sooner if she has increased difficulties.  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     Hewitt Shorts, M.D.     RWN/MEDQ  D:  12/13/2010  T:  12/13/2010  Job:  [id number]

## 2010-12-13 NOTE — Progress Notes (Signed)
Pt discharged pt condition stable instructions given  Equipment delivered, prescriptions given

## 2010-12-13 NOTE — Plan of Care (Signed)
Problem: Consults Goal: Diagnosis - Spinal Surgery Outcome: Completed/Met Date Met:  12/13/10 Thoraco/Lumbar Spine Fusion

## 2010-12-13 NOTE — Op Note (Signed)
NAME:  DOLL, FRAZEE NO.:  [id number]  MEDICAL RECORD NO.:  [id number]  LOCATION:  3025                         FACILITY:  [facility]  PHYSICIAN:  Hewitt Shorts, M.D.DATE OF BIRTH:  [birth year]  DATE OF PROCEDURE:  12/09/2010 DATE OF DISCHARGE:                              OPERATIVE REPORT   PREOPERATIVE DIAGNOSES:  L3-4 lumbar disk herniation with lumbar degenerative disk disease, lumbar spondylosis, and lumbago.  POSTOP DIAGNOSIS:  L3-4 lumbar disk herniation lumbar degenerative disk disease, lumbar spondylosis, lumbago, and bilateral L3 pars interarticularis defect.  PROCEDURE:  L3 Gill procedure.  Bilateral L3-4 lumbar decompression including facetectomy and foraminotomy with decompression of the exiting L3 and L4 nerve roots bilaterally with microdissection, microsurgical technique with decompression beyond that required for interbody arthrodesis, bilateral L3-4, post lumbar interbody arthrodesis with AVS PEEK interbody implants, and Vitoss bone marrow aspirate and Infuse and bilateral L3-4 posterolateral arthrodesis with radius post instrumentation Vitoss bone marrow aspirate locally harvested morselized autograft and Infuse.  SURGEON:  Hewitt Shorts, MD.  Threasa HeadsFranky Macho  ANESTHESIA:  General endotracheal.  INDICATIONS:  The patient is a 35 year old woman who presented with low back pain with pain extending into the lower extremities bilaterally. MRI scan revealed a large L3-4 central disk herniation with significant thecal sac and nerve root compression and stenosis of the canal and decision was made to proceed with decompression and stabilization.  PROCEDURE:  The patient was brought to the operating room and placed under general endotracheal anesthesia.  The patient was turned to the prone position.  Lumbar region was prepped with Betadine soap solution, draped in a sterile fashion.  An x-ray was taken.  The L3-4 level identified,  the midline incision was made over the L3-4 level.  The midline was infiltrated with local anesthetic with epinephrine.  The incision was carried down through the subcutaneous tissue to the lumbar fascia which was incised bilaterally.  It was noted that the L3 spinous processes was loose, subperiosteal dissection was performed and self- retaining retractors were placed.  We went back and reviewed the patient's x-rays and noted the fact she had bilateral L3 pars interarticularis defect consistent with the loose posterior elements of L3.  This was dissected laterally exposing the pars defect the transverse process of L3 and L4 bilaterally, and then we decided to perform a Gill procedure.  The interspinous ligament were cut at the L2- 3 and L3-4 levels and we began to separate the posterior element of L3 from the ligamentum flavum.  We gradually mobilized the posterior elements.  We then freed the ligamentum flavum at the L2-3 level from the posterior elements of L3 and removed the L3 posterior element en bloc.  The bone was then cleaned of all soft tissue.  The cartilaginous surfaces of the facets was removed as well and then the bone was processed __________ morselized locally harvested autograft.  We then continued our dissection laterally.  It was noted that the posterior element did include a portion of the inferior medial aspect of the left L3 pedicle on the exiting L3 nerve root was identified. Overlying ligamentous tissue was carefully removed, and the exiting nerve roots decompressed on the right side more  of the L3 pedicle remained but we did go ahead and removed the overlying ligament tissue and decompressed the exiting L3 nerve root.  We similarly removed the remaining within the flavum from the L3-4 level, and decompressed the exiting L4 nerve roots bilaterally.  We then proceeded with the interbody arthrodesis epidural veins overlying the annulus were coagulated and divided  and then the annulus of the L3-4 disk was incised bilaterally and the disk space entered and a thorough diskectomy was performed using a variety of pituitary rongeurs and micro curettes.  Spondylitic overgrowth in the posterior aspect of the disk space was removed using an osteophyte removal tool, and then the central disk herniation was carefully removed decompressing the ventral aspect of the thecal sac.  Once the diskectomy was completed, the endplates of the L3 and L4 vertebra were prepared removing the cartilaginous endplate surfaces down to a good bony surface.  We measured the height of the intervertebral disk space and selected two 9 x 25 x 4 degree implants.  We then draped the C-arm fluoroscope was brought into the field and we proceeded with placing pedicle screws.  Pedicle entry points were identified bilaterally at L4 and both the L4 pedicles were probed.  Each was examined with ball probe.  Good bony surfaces were found and then at each was tapped with a 5.25 mm tap.  Again, examined with a ball probe. Good threading was noted no bony cut offs were found and then we placed 5.75 x 40 mm screws bilaterally.  Prior to tapping the pedicles, we had aspirated bone marrow aspirate from the vertebral body and injected over 10 mL strip of the Vitoss.  We then identified pedicle entry sites at the L3 level.  The right L3 pedicle was probed, examined the ball probe, tapped again examined the ball probe and a 5.75 x 40 mm screw placed on the left-sided L3 due to the inferior medial portion of the pedicle being absent we did a more lateral to medial trajectory and the pedicle was probed, examined the ball probe.  Good bony surfaces were found.  It was tapped with a 5.25 mm tap.  Again, examined with a ball probe.  Good threading was noted, good bony surface were noted and again we placed 5.75 x 40 mm screw.  Then, we went ahead and placed the interbody devices, retracted the thecal  sac and nerve root medially.  We placed the first implant on the right side.  It was countersunk, we then went to the left side, placed the second implant on the left side again retracted the thecal sac and nerve root.  We then packed the lateral aspect of the disk space on each side.  With a combination of Infuse and Vitoss with bone marrow aspirate.  We then proceeded with the posterolateral arthrodesis.  The transverse process of L3-L4, had been decorticated and we packed a combination of Infuse and Vitoss with bone marrow aspirate and locally harvested morselized autograft in the lateral gutters.  It was gently tamped down against the transverse processes.  We then selected 2 and 30 mm prelordosed rods and placed within the screw heads and secured with locking caps.  Once all 4 locking caps were placed, final tightening was performed against the counter torque.  The wound was irrigated with bacitracin solution numerous times the procedure.  We checked the thecal sac and nerve roots to ensure that none of the bone graft material had fallen down against it and then  we proceeded with closure.  Paraspinal muscle was approximated with interrupted undyed 1-0 Vicryl sutures. Deep fascia closed with interrupted undyed 1-0 Vicryl sutures.  Scarpa fascia was closed with interrupted undyed 1-0 Vicryl sutures.  The subcutaneous and subcuticular were closed with interrupted inverted 2-0 and 3-0 undyed Vicryl sutures.  Skin was closed with Dermabond. Procedure was tolerated well.  The estimated blood loss was 350 mL.  We did use a Cell Saver. We were able to return 110 mL of Cell Saver blood to the patient. Sponge and needle count correct.  Following surgery, the patient was turned back to supine position, to be reversed from the anesthetic, extubated, transferred to the recovery room for further care.     Hewitt Shorts, M.D.     RWN/MEDQ  D:  12/09/2010  T:  12/10/2010  Job:   [id number]  Electronically Signed by Shirlean Kelly M.D. on 12/13/2010 01:43:57 PM

## 2010-12-13 NOTE — H&P (Signed)
NAME:  Toni Frank, MATTHIES NO.:  [id number]  MEDICAL RECORD NO.:  [id number]  LOCATION:  3025                         FACILITY:  [facility]  PHYSICIAN:  Hewitt Shorts, M.D.DATE OF BIRTH:  15-Apr-1975  DATE OF ADMISSION:  12/09/2010 DATE OF DISCHARGE:  12/13/2010                             HISTORY & PHYSICAL   ADMISSION HISTORY AND PHYSICAL EXAMINATION:  The patient is a 35 year old right-handed white female, who was evaluated for a large central L3- 4 disk herniation with resulting low back and bilateral lower extremity pain.  Symptoms began in June 2011.  She had undergone extensive treatments including chiropractic care, epidural steroid injections, NSAIDs, and narcotic analgesics.  She had consulted with a number of physicians including Dr. Lunette Stands, Dr. Stasia Cavalier, and Dr. Alison Stalling in [place] and eventually decision was made to proceed with L3-4 lumbar decompression, PLIF, and PLA and the patient is now admitted for such.  PAST MEDICAL HISTORY:  Notable for no history of hypertension, myocardial infarction, cancer, stroke, diabetes, peptic ulcer disease, lung disease.  Her only previous surgery was a tubal ligation in December 2011.  She reports an allergy to SULFA causing hives.  FAMILY HISTORY:  Mother is in good health.  Father is in fair health but does have some troubles with his back and had a cholecystectomy.  SOCIAL HISTORY:  The patient is married.  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 She does not smoke, drink, or beverages or have a history of substance abuse.  REVIEW OF SYSTEMS:  Notable for those described in the history of present illness and past medical history but is otherwise unremarkable.  PHYSICAL EXAMINATION:  GENERAL:  The patient is a well-developed, well- nourished, white female in no acute distress.  VITAL SIGNS:  Her temperature is 98.1, pulse 77, blood pressure 117/78, respiratory  rate 18, height 5 feet 4 inches, weight 155 pounds. LUNGS:  Clear to auscultation.  She has symmetrical respiratory excursion. HEART:  Regular rate and rhythm.  Normal S1, S2.  There is no murmur. EXTREMITIES:  No clubbing, cyanosis, or edema. MUSCULOSKELETAL:  No tenderness to palpation over the lumbar spinous process or paralumbar musculature.  She is able to flex 90 degrees, able to extend 10 degrees, but has discomfort with extension on the left side of the low back.  Straight leg raise is positive on the left, negative on the right. NEUROLOGIC:  Shows 5/5 strength in the lower extremities of the iliopsoas, quadriceps dorsiflexors, extensor hallucis longus, and plantar flexor bilaterally.  Sensation is intact to pinprick in the distal lower extremities.  Reflexes are 2 at the quadriceps, gastrocnemius, symmetrical bilaterally.  Toes are downgoing bilaterally. She has normal gait and stance.  MRI scan shows no significant degenerative changes at the L3-4 level with desiccation of disc and a large central disk herniation.  Other levels show only mild-to-minor degenerative changes.  IMPRESSION:  The patient with persistent low back and bilateral extremity pain, who has undergone extensive treatment without relief. She is admitted now for surgical decompression and stabilization.  PLAN:  The patient will be admitted for a bilateral L3-4 lumbar decompression post  lumbar interbody fusion with interbody implants and bone graft with posterolateral arthrodesis with post instrumentation and bone graft.  I spoke with the patient and her husband regarding her condition, options for treatment care, the nature of the surgical procedure and its risks including risks of infection, bleeding, possible transfusion, risk of nerve dysfunction, pain, weakness, numbness, paresthesias, risk of failure of the arthrodesis, possible need for further surgery, anesthetic risks, myocardial function,  stroke, pneumonia, and death. Understanding all this, she would like to go ahead with surgery and admitted for such.     Hewitt Shorts, M.D.     RWN/MEDQ  D:  12/13/2010  T:  12/13/2010  Job:  [id number]

## 2011-03-10 ENCOUNTER — Ambulatory Visit: Payer: BC Managed Care – PPO | Admitting: Family Medicine

## 2011-03-15 ENCOUNTER — Encounter: Payer: Self-pay | Admitting: Family Medicine

## 2011-03-15 ENCOUNTER — Ambulatory Visit (INDEPENDENT_AMBULATORY_CARE_PROVIDER_SITE_OTHER): Payer: BC Managed Care – PPO | Admitting: Family Medicine

## 2011-03-15 VITALS — BP 124/84 | Temp 98.2°F | Wt 158.0 lb

## 2011-03-15 DIAGNOSIS — J069 Acute upper respiratory infection, unspecified: Secondary | ICD-10-CM

## 2011-03-15 MED ORDER — HYDROCODONE-HOMATROPINE 5-1.5 MG/5ML PO SYRP: ORAL_SOLUTION | ORAL | Status: DC

## 2011-03-15 MED ORDER — PREDNISONE 20 MG PO TABS: ORAL_TABLET | ORAL | Status: DC

## 2011-03-15 NOTE — Progress Notes (Signed)
  Subjective:    Patient ID: Toni Frank, female    DOB: 1975/07/26, 36 y.o.   MRN: [id number]  HPI  Toni Frank is a 36 year old married female nonsmoker who comes in today for evaluation of a cough head congestion for 3 weeks  She has a history of allergic rhinitis and asthma and 3 weeks ago she picked up one of her children's cold symptoms coughing and congestion since that time. No fever earache.  Review of Systems General and pulmonary venous systems otherwise negative    Objective:   Physical Exam  Well-developed and nourished female no acute distress HEENT negative neck was supple no adenopathy lungs were clear  When I listen to her lungs I noticed 2 abnormal lesions on her back one is on the bra line on the right side the other one is left lower scapular area that does have dark pigment and advised to return for removal      Assessment & Plan:  Viral syndrome with secondary allergic symptoms plan prednisone burst and taper  2 abnormal moles return ASAP for removal

## 2011-03-15 NOTE — Patient Instructions (Signed)
Take the short course of prednisone as directed  When you're finished with the prednisone if you're still symptomatic take 10 mg of plain Zyrtec at that time  Set up a time in the next couple weeks to remove the 2 lesions from your back

## 2011-03-22 ENCOUNTER — Ambulatory Visit (INDEPENDENT_AMBULATORY_CARE_PROVIDER_SITE_OTHER): Payer: BC Managed Care – PPO | Admitting: Family Medicine

## 2011-03-22 ENCOUNTER — Other Ambulatory Visit: Payer: Self-pay | Admitting: Family Medicine

## 2011-03-22 ENCOUNTER — Encounter: Payer: Self-pay | Admitting: Family Medicine

## 2011-03-22 DIAGNOSIS — D235 Other benign neoplasm of skin of trunk: Secondary | ICD-10-CM

## 2011-03-22 NOTE — Patient Instructions (Signed)
We will call you within 2 weeks

## 2011-03-22 NOTE — Progress Notes (Signed)
  Subjective:    Patient ID: Toni Frank, female    DOB: September 15, 1975, 36 y.o.   MRN: [id number]  HPI Toni Frank is a 36 year old female who comes in today for removal of 2 dark moles on her back  Lesion #1 is 5 mm x5 mm right lower lateral back  Lesion #2 is 5 mm x 5 mm midline T10  After informed consent the lesions were cleaned with alcohol and anesthetized with 1% Xylocaine with epinephrine. The lesions were excised with 3 mm margins bases were cauterized Band-Aids was applied. The specimens were sent for pathologic analysis. She tolerated the procedure no complications   Review of Systems Negative procedure see above    Objective:   Physical Exam  Procedure see above      Assessment & Plan:  Dysplastic nevi clinically path pending

## 2011-05-03 ENCOUNTER — Encounter: Payer: Self-pay | Admitting: Family Medicine

## 2011-05-03 ENCOUNTER — Ambulatory Visit (INDEPENDENT_AMBULATORY_CARE_PROVIDER_SITE_OTHER): Payer: BC Managed Care – PPO | Admitting: Family Medicine

## 2011-05-03 VITALS — BP 110/70 | Temp 98.2°F | Wt 150.0 lb

## 2011-05-03 DIAGNOSIS — F909 Attention-deficit hyperactivity disorder, unspecified type: Secondary | ICD-10-CM

## 2011-05-03 MED ORDER — AMPHETAMINE-DEXTROAMPHETAMINE 10 MG PO TABS: 10.0000 mg | ORAL_TABLET | Freq: Two times a day (BID) | ORAL | Status: AC

## 2011-05-03 NOTE — Patient Instructions (Signed)
Begin the Adderall 10 mg twice daily  Check with your insurance company to see if they cover a long-acting product  Return in 2 weeks for followup

## 2011-05-03 NOTE — Progress Notes (Signed)
  Subjective:    Patient ID: Toni Frank, female    DOB: September 28, 1975, 36 y.o.   MRN: [id number]  HPI [name] is a 36 year old married female G3 P3 who comes in today for evaluation of a contact dermatitis from her deodorant and ADD  I recently diagnosed her to have ADD when she was about 36 years of age. She has partially grown out of it but now with stressors of 3 children and going back to school she's having difficulty focusing and concentrating and likely start her medication.   Review of Systems    general and psychiatric review of systems otherwise negative Objective:   Physical Exam Well-developed well-nourished female in no acute distress       Assessment & Plan:  Adult ADD plan restart Adderall return 2 weeks

## 2011-05-04 ENCOUNTER — Telehealth: Payer: Self-pay | Admitting: *Deleted

## 2011-05-04 MED ORDER — METHYLPHENIDATE HCL ER 20 MG PO TBCR: 20.0000 mg | EXTENDED_RELEASE_TABLET | ORAL | Status: DC

## 2011-05-04 NOTE — Telephone Encounter (Signed)
Pt took one Adderal XR 10 mg. Yesterday and became very anxious and agitated with her family.  Does not want to continue that.  She did find out that her insurance would pay for Ritalin SR 20mg .  Is asking for advice from Dr. Tawanna Cooler.

## 2011-05-04 NOTE — Telephone Encounter (Signed)
rx ready for pick up and patient is aware  

## 2011-05-18 ENCOUNTER — Ambulatory Visit (INDEPENDENT_AMBULATORY_CARE_PROVIDER_SITE_OTHER): Payer: BC Managed Care – PPO | Admitting: Family Medicine

## 2011-05-18 ENCOUNTER — Encounter: Payer: Self-pay | Admitting: Family Medicine

## 2011-05-18 VITALS — BP 110/80 | Temp 98.6°F | Wt 147.0 lb

## 2011-05-18 DIAGNOSIS — F909 Attention-deficit hyperactivity disorder, unspecified type: Secondary | ICD-10-CM

## 2011-05-18 MED ORDER — METHYLPHENIDATE HCL ER 20 MG PO TBCR: 20.0000 mg | EXTENDED_RELEASE_TABLET | ORAL | Status: DC

## 2011-05-18 MED ORDER — METHYLPHENIDATE HCL 5 MG PO TABS: ORAL_TABLET | ORAL | Status: DC

## 2011-05-18 MED ORDER — METHYLPHENIDATE HCL ER 20 MG PO TBCR: EXTENDED_RELEASE_TABLET | ORAL | Status: DC

## 2011-05-18 NOTE — Patient Instructions (Signed)
Continued your current medication,,,,,,,,,,, 1 daily in the morning   5 mg tablet daily in the afternoon when necessary

## 2011-05-18 NOTE — Progress Notes (Signed)
  Subjective:    Patient ID: Toni Frank, female    DOB: Nov 13, 1975, 36 y.o.   MRN: [id number]  HPI [name] is a 36 year old married female nonsmoker who comes in today for followup of adult ADD  She's currently on the long-acting methylphenidate 20 mg and doing well except it wears off around 3 in the afternoon. We discussed taking it 5 mg supplement when necessary   Review of Systems    general and psychiatric review of systems otherwise negative Objective:   Physical Exam Well-developed well nourished female in no acute distress       Assessment & Plan:  Adult ADD continue current medicine add 5 mg in the afternoon when necessary

## 2011-06-06 ENCOUNTER — Telehealth: Payer: Self-pay | Admitting: Family Medicine

## 2011-06-06 NOTE — Telephone Encounter (Signed)
[facility] Triage Call Report Triage Record Num: [id number] Operator: Toni Frank Patient Name: Toni Frank Call Date & Time: 06/05/2011 9:38:37AM Patient Phone: [phone] PCP: Toni Frank Patient Gender: Female PCP Fax : [phone] Patient DOB: June 05, 1975 Practice Name: Toni Frank Reason for Call: Caller: Toni Frank/Patient; PCP: Toni Pee.; CB#: [id number]; Call regarding Has a small pea sized white mouth ulcer in front of mouth on inner lip- onset 05/31/11. She has been doing salt water rinses and Peroxile Mouthwash and won't go away. Leaving to go on second honeymoon tomorrow to [place] and is wondering if any medication needed to treat ulcer. MD on call contacted, Dr. Tawanna Frank. Acyclovir 400 mgs 1 PO TID #30, 1 refill ordered and called in to [facility] on [address] [phone] and spoke to Toni Frank, [place]. Care advice per Mouth Lesions Protocol. Protocol(s) Used: Mouth Lesions Recommended Outcome per Protocol: See Provider within 24 hours Reason for Outcome: Painful pale yellow or white spot(s) on the lining of the mouth, gums, or tongue unresponsive to 72 hours of home care or that are unusually large Care Advice: ~ Avoid eating salty, spicy, or acidic foods. Call provider if signs of dehydration develop (such as extreme thirst, dry mouth and tongue, dry skin, or restlessness/irritability). ~ Consider using over-the-counter medication or lozenges to numb the ulcer and relieve the pain as directed on label or by pharmacist. ~ The sores can be cleaned several times a day using a solution of equal amounts of water and 3% hydrogen peroxide. Use a cotton swab to apply this mixture. Follow this with an application of Milk of Magnesia using a clean cotton swab. ~ ~ SYMPTOM / CONDITION MANAGEMENT ~ CAUTIONS ~ Try to avoid trauma to your mouth - chew slowly, avoid chewing and talking at the same time. ~ Use a soft bristle toothbrush to gently brush teeth  at least twice a day. Floss at least once a day. Increase intake of fluids. Drinking through a straw may reduce discomfort. Eat foods that are easy to swallow. Avoid drinking carbonated or alcoholic drinks. ~ Analgesic/Antipyretic Advice - Acetaminophen: Consider acetaminophen as directed on label or by pharmacist/provider for pain or fever PRECAUTIONS: - Use if there is no history of liver disease, alcoholism, or intake of three or more alcohol drinks per day - Only if approved by provider during pregnancy or when breastfeeding - During pregnancy, acetaminophen should not be taken more than 3 consecutive days without telling provider - Do not exceed recommended dose or frequency ~ Rinse mouth with a salt solution (1/2 tsp. salt in 8 ounces [.2 liter] of water) or an antiseptic mouthwash without alcohol 3 or more times a day after eating to relieve symptoms. The solution should not be swallowed. ~ Analgesic/Antipyretic Advice - NSAIDs: Consider aspirin, ibuprofen, naproxen or ketoprofen for pain or fever as directed on label or by pharmacist/provider. PRECAUTIONS: - If over 110 years of age, should not take longer than 1 week without consulting provider. EXCEPTIONS: ~

## 2011-06-13 ENCOUNTER — Other Ambulatory Visit (INDEPENDENT_AMBULATORY_CARE_PROVIDER_SITE_OTHER): Payer: BC Managed Care – PPO

## 2011-06-13 DIAGNOSIS — Z0000 Encounter for general adult medical examination without abnormal findings: Secondary | ICD-10-CM

## 2011-06-13 LAB — BASIC METABOLIC PANEL
BUN: 14 mg/dL (ref 6–23)
CO2: 25 mEq/L (ref 19–32)
Chloride: 105 mEq/L (ref 96–112)
Glucose, Bld: 80 mg/dL (ref 70–99)
Potassium: 3.9 mEq/L (ref 3.5–5.1)

## 2011-06-13 LAB — CBC WITH DIFFERENTIAL/PLATELET
Eosinophils Relative: 0.4 % (ref 0.0–5.0)
HCT: 36.2 % (ref 36.0–46.0)
Lymphs Abs: 2.2 10*3/uL (ref 0.7–4.0)
MCV: 92.8 fl (ref 78.0–100.0)
Monocytes Absolute: 0.4 10*3/uL (ref 0.1–1.0)
Platelets: 227 10*3/uL (ref 150.0–400.0)
RDW: 13.6 % (ref 11.5–14.6)
WBC: 5.6 10*3/uL (ref 4.5–10.5)

## 2011-06-13 LAB — POCT URINALYSIS DIPSTICK
Bilirubin, UA: NEGATIVE
Leukocytes, UA: NEGATIVE
Nitrite, UA: NEGATIVE
Protein, UA: NEGATIVE
Urobilinogen, UA: 0.2
pH, UA: 7.5

## 2011-06-13 LAB — HEPATIC FUNCTION PANEL
ALT: 12 U/L (ref 0–35)
Total Bilirubin: 0.6 mg/dL (ref 0.3–1.2)

## 2011-06-13 LAB — TSH: TSH: 0.72 u[IU]/mL (ref 0.35–5.50)

## 2011-06-13 LAB — LIPID PANEL: HDL: 45.6 mg/dL (ref >39.00)

## 2011-06-20 ENCOUNTER — Encounter: Payer: BC Managed Care – PPO | Admitting: Family Medicine

## 2011-06-28 ENCOUNTER — Encounter: Payer: Self-pay | Admitting: Family Medicine

## 2011-06-28 ENCOUNTER — Ambulatory Visit (INDEPENDENT_AMBULATORY_CARE_PROVIDER_SITE_OTHER): Payer: BC Managed Care – PPO | Admitting: Family Medicine

## 2011-06-28 VITALS — BP 110/80 | Temp 98.0°F | Ht 64.0 in | Wt 142.0 lb

## 2011-06-28 DIAGNOSIS — F909 Attention-deficit hyperactivity disorder, unspecified type: Secondary | ICD-10-CM

## 2011-06-28 DIAGNOSIS — D680 Von Willebrand's disease: Secondary | ICD-10-CM

## 2011-06-28 DIAGNOSIS — M5116 Intervertebral disc disorders with radiculopathy, lumbar region: Secondary | ICD-10-CM

## 2011-06-28 DIAGNOSIS — M5126 Other intervertebral disc displacement, lumbar region: Secondary | ICD-10-CM

## 2011-06-28 DIAGNOSIS — Z0000 Encounter for general adult medical examination without abnormal findings: Secondary | ICD-10-CM

## 2011-06-28 MED ORDER — EPINEPHRINE 0.3 MG/0.3ML IJ DEVI: 0.3000 mg | Freq: Once | INTRAMUSCULAR | Status: DC

## 2011-06-28 NOTE — Patient Instructions (Signed)
Call 2 weeks from being out of your third prescriptions for refills  Do a thorough breast exam and skin exam monthly  Return in one year or sooner if any problems

## 2011-06-28 NOTE — Progress Notes (Signed)
  Subjective:    Patient ID: Toni Frank, female    DOB: [birth year], 36 y.o.   MRN: [id number]  HPI [name] is a 36 year old married female nonsmoker who comes in today for general physical examination  She has a history of rosacea for which he uses MetroGel each bedtime  She also has a history of urticaria etiology unknown we'll refill EpiPen  She also has a history of adult ADD and she takes a long-acting Ritalin 20 mg daily 5 mg at 3 PM when necessary  Last August she had lumbar disc surgery she's doing well  October she had abnormal Pap she is due to followup by GYN  Tetanus 2012  She's had a history of dysplastic nevi on her trunk   Review of Systems  Constitutional: Negative.   HENT: Negative.   Eyes: Negative.   Respiratory: Negative.   Cardiovascular: Negative.   Gastrointestinal: Negative.   Genitourinary: Negative.   Musculoskeletal: Negative.   Neurological: Negative.   Hematological: Negative.   Psychiatric/Behavioral: Negative.        Objective:   Physical Exam  Constitutional: She appears well-developed and well-nourished.  HENT:  Head: Normocephalic and atraumatic.  Right Ear: External ear normal.  Left Ear: External ear normal.  Nose: Nose normal.  Mouth/Throat: Oropharynx is clear and moist.  Eyes: EOM are normal. Pupils are equal, round, and reactive to light.  Neck: Normal range of motion. Neck supple. No thyromegaly present.  Cardiovascular: Normal rate, regular rhythm, normal heart sounds and intact distal pulses.  Exam reveals no gallop and no friction rub.   No murmur heard. Pulmonary/Chest: Effort normal and breath sounds normal.  Abdominal: Soft. Bowel sounds are normal. She exhibits no distension and no mass. There is no tenderness. There is no rebound.  Genitourinary: Vagina normal and uterus normal. Guaiac negative stool. No vaginal discharge found.  Musculoskeletal: Normal range of motion.  Lymphadenopathy:    She has no cervical  adenopathy.  Neurological: She is alert. She has normal reflexes. No cranial nerve deficit. She exhibits normal muscle tone. Coordination normal.  Skin: Skin is warm and dry.       Total body skin exam normal she has a 5 mm x 5 mm brown lesion right buttocks about 2 inches from the rectum which is the largest lesion however I do not appreciate any black pigment  Psychiatric: She has a normal mood and affect. Her behavior is normal. Judgment and thought content normal.          Assessment & Plan:  Healthy female  History of dysplastic nevi followup yearly sunscreens daily  History of allergic reaction refill EpiPen  Adult acne MetroGel when necessary  Adult ADD continue Adderall

## 2011-08-02 ENCOUNTER — Other Ambulatory Visit: Payer: Self-pay | Admitting: Family Medicine

## 2011-08-02 NOTE — Telephone Encounter (Signed)
Pt requesting refill on methylphenidate (METADATE ER) 20 MG ER tablet

## 2011-08-03 NOTE — Telephone Encounter (Signed)
ok 

## 2011-08-04 MED ORDER — METHYLPHENIDATE HCL ER 20 MG PO TBCR: 20.0000 mg | EXTENDED_RELEASE_TABLET | Freq: Every day | ORAL | Status: DC

## 2011-10-26 ENCOUNTER — Telehealth: Payer: Self-pay | Admitting: Family Medicine

## 2011-10-26 MED ORDER — METHYLPHENIDATE HCL ER 20 MG PO TBCR: 20.0000 mg | EXTENDED_RELEASE_TABLET | Freq: Every day | ORAL | Status: DC

## 2011-10-26 NOTE — Telephone Encounter (Signed)
Pt req refill of Methylphenidate SR 20mg .

## 2011-10-26 NOTE — Telephone Encounter (Signed)
rx ready for pick up. Left message on machine for patient. 

## 2011-12-08 ENCOUNTER — Other Ambulatory Visit: Payer: Self-pay | Admitting: Obstetrics and Gynecology

## 2011-12-08 DIAGNOSIS — N63 Unspecified lump in unspecified breast: Secondary | ICD-10-CM

## 2011-12-14 ENCOUNTER — Ambulatory Visit
Admission: RE | Admit: 2011-12-14 | Discharge: 2011-12-14 | Disposition: A | Discharge status: Home / Self Care | Payer: BC Managed Care – PPO | Source: Ambulatory Visit | Attending: Obstetrics and Gynecology | Admitting: Obstetrics and Gynecology

## 2011-12-14 DIAGNOSIS — N63 Unspecified lump in unspecified breast: Secondary | ICD-10-CM

## 2011-12-20 ENCOUNTER — Other Ambulatory Visit: Payer: BC Managed Care – PPO

## 2012-01-11 ENCOUNTER — Other Ambulatory Visit: Payer: Self-pay | Admitting: Family Medicine

## 2012-01-11 MED ORDER — METHYLPHENIDATE HCL ER 20 MG PO TBCR: 20.0000 mg | EXTENDED_RELEASE_TABLET | Freq: Every day | ORAL | Status: DC

## 2012-02-14 ENCOUNTER — Telehealth: Payer: Self-pay | Admitting: *Deleted

## 2012-02-14 NOTE — Telephone Encounter (Signed)
Patient Information:  Caller Name: [place]  Phone: [phone]  Patient: Toni Frank, Toni Frank  Gender: Female  DOB: [birth year]  Age: 37 Years  PCP: [facility])  Pregnant: No  Office Follow Up:  Does the office need to follow up with this patient?: No  Instructions For The Office: N/A  RN Note:  Allergy to sulfa.  Denies fever, severe swelling or redness, or other emergent symptoms per protocol; home care measures advised, with callback parameters given.  Per standing orders, Rx for vigamox eye drops, 1 gtt OU TID x 7 days, disp qs, NR called in to [facility] [phone].  krs/can  Symptoms  Reason For Call & Symptoms: pink eye symptomis  Reviewed Health History In EMR: Yes  Reviewed Medications In EMR: Yes  Reviewed Allergies In EMR: Yes  Reviewed Surgeries / Procedures: Yes  Date of Onset of Symptoms: 02/14/2012 OB / GYN:  LMP: Unknown  Guideline(s) Used:  Eye - Pus or Discharge  Disposition Per Guideline:   Home Care  Reason For Disposition Reached:   Eye with yellow/green discharge or eyelashes stick together, and PCP standing order to call in antibiotic eye drops  Advice Given:  N/A

## 2012-02-14 NOTE — Telephone Encounter (Signed)
Patient is calling because her son had pink eye.  She now has itchy eyes with some crusting.  Please advise.

## 2012-03-06 ENCOUNTER — Telehealth: Payer: Self-pay | Admitting: Family Medicine

## 2012-03-06 NOTE — Telephone Encounter (Signed)
Patient Information:  Caller Name: Ilyana  Phone: [phone]  Patient: Toni Frank, Toni Frank  Gender: Female  DOB: 1975-08-15  Age: 37 Years  PCP: Kelle Darting [facility])  Pregnant: No  Office Follow Up:  Does the office need to follow up with this patient?: No  Instructions For The Office: N/A  RN Note:  Will do a home pregnancy test in am 1/29.  Will call with the results.  Symptoms  Reason For Call & Symptoms: Started feeling 'run down' on 1/17.  Then she started her menstrual cycle  on 1/19 and that was early, shouldn't have started until 2/1.  Has had nausea also.  Had a tubal ligation previously.  The nausea comes in waves, especially after she eats.  No pain.  Her breast were sore for a while after her cycle.  States that her cycle was different with lots of clots.  Has had intermittent headaches and slight dizziness.   Her only change was made by her dermatologist on 1/21.  Her Metrogel was d/c'd and she was started on Minocycline 100mg  po bid and Clindamycin gel for her acne.  She cannot remember if the nausea started prior to the 21st or after starting the new meds she just remembers waking up one morning with nausea.  Reviewed Health History In EMR: Yes  Reviewed Medications In EMR: Yes  Reviewed Allergies In EMR: Yes  Reviewed Surgeries / Procedures: Yes  Date of Onset of Symptoms: 02/24/2012 OB / GYN:  LMP: 02/26/2012  Guideline(s) Used:  Pregnancy - Morning Sickness  Disposition Per Guideline:   Home Care  Reason For Disposition Reached:   Nausea or vomiting  Advice Given:  N/A

## 2012-03-08 ENCOUNTER — Ambulatory Visit: Payer: BC Managed Care – PPO | Admitting: Family Medicine

## 2012-04-24 ENCOUNTER — Other Ambulatory Visit: Payer: Self-pay | Admitting: *Deleted

## 2012-05-08 ENCOUNTER — Other Ambulatory Visit: Payer: Self-pay | Admitting: *Deleted

## 2012-05-08 MED ORDER — METHYLPHENIDATE HCL ER 20 MG PO TBCR: 20.0000 mg | EXTENDED_RELEASE_TABLET | ORAL | Status: DC

## 2012-05-29 ENCOUNTER — Telehealth: Payer: Self-pay | Admitting: Family Medicine

## 2012-05-29 NOTE — Telephone Encounter (Signed)
Patient Information:  Caller Name: Jolin  Phone: [phone]  Patient: Toni Frank, Toni Frank  Gender: Female  DOB: 26-Dec-1975  Age: 37 Years  PCP: Kelle Darting [facility])  Pregnant: No  Office Follow Up:  Does the office need to follow up with this patient?: Yes  Instructions For The Office: Wouuld like to know if there is something she can take for the pain d/t varicose veins or if she would have to be sent to a specialist; has an OV in 6/14 and doesn't want to come in if she will only be sent to a specialist and her insurance does not require a referral; please call back   Symptoms  Reason For Call & Symptoms: c/o discomfort to left leg; says the pain has worsened within the past 1-2 months, particularly with exercise; wonders if she should come in or see a specialist  Reviewed Health History In EMR: Yes  Reviewed Medications In EMR: Yes  Reviewed Allergies In EMR: Yes  Reviewed Surgeries / Procedures: Yes  Date of Onset of Symptoms: Unknown  Treatments Tried: Advil  Treatments Tried Worked: Yes OB / GYN:  LMP: Unknown  Guideline(s) Used:  Leg Pain  Disposition Per Guideline:   See Today or Tomorrow in Office  Reason For Disposition Reached:   Localized pain, redness or hard lump along vein  Advice Given:  N/A  RN Overrode Recommendation:  Follow Up With Office Later  Would like to hear back from office prior to an appt

## 2012-05-29 NOTE — Telephone Encounter (Signed)
Per Dr Tawanna Cooler refer to Dr Hart Rochester.  Information given to pt

## 2012-07-10 ENCOUNTER — Other Ambulatory Visit: Payer: BC Managed Care – PPO

## 2012-07-11 ENCOUNTER — Other Ambulatory Visit (INDEPENDENT_AMBULATORY_CARE_PROVIDER_SITE_OTHER): Payer: PRIVATE HEALTH INSURANCE

## 2012-07-11 DIAGNOSIS — Z0000 Encounter for general adult medical examination without abnormal findings: Secondary | ICD-10-CM

## 2012-07-11 LAB — POCT URINALYSIS DIPSTICK
Bilirubin, UA: NEGATIVE
Glucose, UA: NEGATIVE
Ketones, UA: NEGATIVE
Leukocytes, UA: NEGATIVE
Nitrite, UA: NEGATIVE
pH, UA: 7.5

## 2012-07-11 LAB — HEPATIC FUNCTION PANEL
ALT: 11 U/L (ref 0–35)
Bilirubin, Direct: 0.1 mg/dL (ref 0.0–0.3)
Total Bilirubin: 0.6 mg/dL (ref 0.3–1.2)

## 2012-07-11 LAB — LIPID PANEL
Cholesterol: 140 mg/dL (ref 0–200)
HDL: 50.2 mg/dL (ref >39.00)
Triglycerides: 54 mg/dL (ref 0.0–149.0)
VLDL: 10.8 mg/dL (ref 0.0–40.0)

## 2012-07-11 LAB — CBC WITH DIFFERENTIAL/PLATELET
Basophils Absolute: 0 10*3/uL (ref 0.0–0.1)
Eosinophils Relative: 0.7 % (ref 0.0–5.0)
MCV: 94.1 fl (ref 78.0–100.0)
Monocytes Absolute: 0.4 10*3/uL (ref 0.1–1.0)
Monocytes Relative: 6.7 % (ref 3.0–12.0)
Neutrophils Relative %: 49 % (ref 43.0–77.0)
Platelets: 251 10*3/uL (ref 150.0–400.0)
RDW: 13.1 % (ref 11.5–14.6)
WBC: 6.1 10*3/uL (ref 4.5–10.5)

## 2012-07-11 LAB — BASIC METABOLIC PANEL
BUN: 11 mg/dL (ref 6–23)
Creatinine, Ser: 0.6 mg/dL (ref 0.4–1.2)
GFR: 113.16 mL/min (ref >60.00)
Glucose, Bld: 86 mg/dL (ref 70–99)

## 2012-07-11 LAB — TSH: TSH: 0.94 u[IU]/mL (ref 0.35–5.50)

## 2012-07-13 ENCOUNTER — Encounter: Payer: Self-pay | Admitting: Family Medicine

## 2012-07-13 ENCOUNTER — Ambulatory Visit (INDEPENDENT_AMBULATORY_CARE_PROVIDER_SITE_OTHER): Payer: PRIVATE HEALTH INSURANCE | Admitting: Family Medicine

## 2012-07-13 VITALS — BP 110/76 | Temp 98.6°F | Ht 64.5 in | Wt 139.0 lb

## 2012-07-13 DIAGNOSIS — L989 Disorder of the skin and subcutaneous tissue, unspecified: Secondary | ICD-10-CM

## 2012-07-13 DIAGNOSIS — D680 Von Willebrand's disease: Secondary | ICD-10-CM

## 2012-07-13 DIAGNOSIS — D235 Other benign neoplasm of skin of trunk: Secondary | ICD-10-CM

## 2012-07-13 DIAGNOSIS — F909 Attention-deficit hyperactivity disorder, unspecified type: Secondary | ICD-10-CM

## 2012-07-13 DIAGNOSIS — Z0000 Encounter for general adult medical examination without abnormal findings: Secondary | ICD-10-CM

## 2012-07-13 DIAGNOSIS — N6012 Diffuse cystic mastopathy of left breast: Secondary | ICD-10-CM

## 2012-07-13 DIAGNOSIS — N6019 Diffuse cystic mastopathy of unspecified breast: Secondary | ICD-10-CM

## 2012-07-13 NOTE — Progress Notes (Signed)
  Subjective:    Patient ID: Toni Frank, female    DOB: 1975-12-17, 37 y.o.   MRN: [id number]  HPI Toni Frank is a 37 year old married female G3 P3,,,,,,, BTL,,,,,,,, nonsmoker who comes in today for general physical examination  She has a history of adult ADD and takes methylphenidate 20 mg daily  She gets yearly eye exams because she wears contacts, regular dental care, mammography this winter because of a breast lump it turned out to be benign  Pelvic exam by GYN because of cryosurgery normal this winter  She has 3 boys    Review of Systems  Constitutional: Negative.   HENT: Negative.   Eyes: Negative.   Respiratory: Negative.   Cardiovascular: Negative.   Gastrointestinal: Negative.   Genitourinary: Negative.   Musculoskeletal: Negative.   Neurological: Negative.   Psychiatric/Behavioral: Negative.        Objective:   Physical Exam  Constitutional: She appears well-developed and well-nourished.  HENT:  Head: Normocephalic and atraumatic.  Right Ear: External ear normal.  Left Ear: External ear normal.  Nose: Nose normal.  Mouth/Throat: Oropharynx is clear and moist.  Eyes: EOM are normal. Pupils are equal, round, and reactive to light.  Neck: Normal range of motion. Neck supple. No thyromegaly present.  Cardiovascular: Normal rate, regular rhythm, normal heart sounds and intact distal pulses.  Exam reveals no gallop and no friction rub.   No murmur heard. Pulmonary/Chest: Effort normal and breath sounds normal.  Abdominal: Soft. Bowel sounds are normal. She exhibits no distension and no mass. There is no tenderness. There is no rebound.  Genitourinary:   Recommended BSE monthly and annual mammography  Musculoskeletal: Normal range of motion.  Lymphadenopathy:    She has no cervical adenopathy.  Neurological: She is alert. She has normal reflexes. No cranial nerve deficit. She exhibits normal muscle tone. Coordination normal.  Skin: Skin is warm and dry.  Total  body skin exam normal except for 2 lesions #1 5 mm x 5 mm left hip #2 5 mm x 5 mm right hip  Both lesions although small were black. She was taken to the treatment room and after informed consent the lesions were removed with 3 mm margins. There were sent for pathologic analysis. The bases cauterized Band-Aids were applied Path pending  Psychiatric: She has a normal mood and affect. Her behavior is normal. Judgment and thought content normal.          Assessment & Plan:  Healthy female  History of adult ADD continue methylphenidate 20 mg long-acting daily  Fibrocystic breast changes bilaterally recommend BSE monthly and you mammography  2 abnormal lesions removed today  Status post lumbar disc surgery  History of von Willebrand's disease asymptomatic

## 2012-07-13 NOTE — Patient Instructions (Signed)
Continue your current medications  Followup in 1 year sooner if any problems  I will call you I gets her path report

## 2012-07-19 ENCOUNTER — Other Ambulatory Visit: Payer: BC Managed Care – PPO

## 2012-07-24 ENCOUNTER — Encounter: Payer: BC Managed Care – PPO | Admitting: Family Medicine

## 2012-08-02 ENCOUNTER — Encounter: Payer: BC Managed Care – PPO | Admitting: Family Medicine

## 2012-09-03 ENCOUNTER — Telehealth: Payer: Self-pay | Admitting: Family Medicine

## 2012-09-03 MED ORDER — METHYLPHENIDATE HCL ER 20 MG PO TBCR: 20.0000 mg | EXTENDED_RELEASE_TABLET | ORAL | Status: DC

## 2012-09-03 NOTE — Telephone Encounter (Signed)
PT called and stated that she needed a 3 month refill of her methylphenidate (METADATE ER) 20 MG ER tablet. Please assist.

## 2012-09-03 NOTE — Telephone Encounter (Signed)
Rx ready for pick up left message on machine for patient

## 2012-11-20 ENCOUNTER — Telehealth: Payer: Self-pay | Admitting: Family Medicine

## 2012-11-20 NOTE — Telephone Encounter (Addendum)
Pt states she was referred to Dr. Hart Rochester for vericose veins and her insurance company is requiring a letter of medical neccessity from her PCP to prove this is not being done for cosmetic purposes.

## 2012-11-23 NOTE — Telephone Encounter (Deleted)
Previous message was incomplete and was not routed to the approproiate recipient  Pt states she was referred to Dr. Hart Rochester for vericose veins.  Her insurance company requires a letter of medical neccessity to be

## 2012-11-26 ENCOUNTER — Encounter: Payer: Self-pay | Admitting: *Deleted

## 2012-11-26 ENCOUNTER — Telehealth: Payer: Self-pay | Admitting: Vascular Surgery

## 2012-11-26 ENCOUNTER — Encounter: Payer: Self-pay | Admitting: Vascular Surgery

## 2012-11-26 MED ORDER — METHYLPHENIDATE HCL ER 20 MG PO TBCR: 20.0000 mg | EXTENDED_RELEASE_TABLET | ORAL | Status: DC

## 2012-11-26 NOTE — Telephone Encounter (Signed)
Letter and Rx ready for pick up and patient is aware

## 2012-11-26 NOTE — Telephone Encounter (Addendum)
Message copied by Fredrich Birks on Mon Nov 26, 2012  1:59 PM ------      Message from: Marcellus Scott      Created: Mon Nov 26, 2012  9:29 AM      Contact: [phone]       Please call [name] she wants to speak with someone in appointments.            Thanks      Revonda Standard ------  11/26/12: patients insurance needs a determination letter in order to pay for the ultrasound that we ordered for her VV appointment. So, she needs to see the doctor first before having an ultrasound. I have cancelled her ultrasound and made Jared aware of the situation. I have also left message for Marisue Ivan to see me regarding this situation. dpm

## 2012-11-27 ENCOUNTER — Ambulatory Visit (INDEPENDENT_AMBULATORY_CARE_PROVIDER_SITE_OTHER): Payer: PRIVATE HEALTH INSURANCE

## 2012-11-27 ENCOUNTER — Encounter: Payer: Self-pay | Admitting: Vascular Surgery

## 2012-11-27 ENCOUNTER — Encounter: Payer: PRIVATE HEALTH INSURANCE | Admitting: Vascular Surgery

## 2012-11-27 ENCOUNTER — Ambulatory Visit (INDEPENDENT_AMBULATORY_CARE_PROVIDER_SITE_OTHER): Payer: PRIVATE HEALTH INSURANCE | Admitting: Vascular Surgery

## 2012-11-27 ENCOUNTER — Encounter (HOSPITAL_COMMUNITY): Payer: PRIVATE HEALTH INSURANCE

## 2012-11-27 ENCOUNTER — Encounter: Payer: Self-pay | Admitting: *Deleted

## 2012-11-27 VITALS — BP 130/81 | HR 80 | Resp 16 | Ht 64.0 in | Wt 140.0 lb

## 2012-11-27 DIAGNOSIS — Z23 Encounter for immunization: Secondary | ICD-10-CM

## 2012-11-27 DIAGNOSIS — I83893 Varicose veins of bilateral lower extremities with other complications: Secondary | ICD-10-CM | POA: Insufficient documentation

## 2012-11-27 NOTE — Progress Notes (Signed)
Subjective:     Patient ID: Toni Frank, female   DOB: January 26, 1976, 37 y.o.   MRN: [id number]  HPI this 37 year old female was referred for evaluation of bilateral venous insufficiency with varicose veins which are painful particularly in the left posterior thigh. She has noted a prominent vein in the lower left thigh across the popliteal fossa which has progressed over the last several months. She has no history of DVT or thrombophlebitis. She has not developed edema in either lower extremity. She also has pain in the right anteromedial thigh from a prominent vein. She has no history of stasis ulcers or bleeding. She is not were elastic compression stockings.  Past Medical History  Diagnosis Date  . PVC (premature ventricular contraction)   . Allergy     acute - cats  . Amenorrhea   . Asthma   . ADD (attention deficit disorder with hyperactivity)   . IBS (irritable bowel syndrome)   . Kidney stones     History  Substance Use Topics  . Smoking status: Former Games developer  . Smokeless tobacco: Not on file  . Alcohol Use: Not on file    Family History  Problem Relation Age of Onset  . Heart disease Other   . Cancer Other     liver  . Colon polyps Other   . GI problems Other     IBS    Allergies  Allergen Reactions  . Cortisone Other (See Comments)    unknown  . Sulfonamide Derivatives     REACTION: hives    Current outpatient prescriptions:clindamycin (CLINDAMAX) 1 % gel, Apply 1 application topically 2 (two) times daily., Disp: , Rfl: ;  methylphenidate (METADATE ER) 20 MG ER tablet, Take 1 tablet (20 mg total) by mouth every morning., Disp: 30 tablet, Rfl: 0;  EPINEPHrine (EPI-PEN) 0.3 mg/0.3 mL DEVI, Inject 0.3 mLs (0.3 mg total) into the muscle once., Disp: 2 Device, Rfl: 1 methylphenidate (METADATE ER) 20 MG ER tablet, Take 1 tablet (20 mg total) by mouth every morning., Disp: 30 tablet, Rfl: 0;  methylphenidate (METADATE ER) 20 MG ER tablet, Take 1 tablet (20 mg total)  by mouth every morning., Disp: 30 tablet, Rfl: 0  BP 130/81  Pulse 80  Resp 16  Ht 5\' 4"  (1.626 m)  Wt 140 lb (63.504 kg)  BMI 24.02 kg/m2  Body mass index is 24.02 kg/(m^2).          Review of Systems complains of pain in legs with walking, varicose veins, mild von Willebrand disease but denies all other symptoms in the complete review of    Objective:   Physical Exam BP 130/81  Pulse 80  Resp 16  Ht 5\' 4"  (1.626 m)  Wt 140 lb (63.504 kg)  BMI 24.02 kg/m2  Gen.-alert and oriented x3 in no apparent distress HEENT normal for age Lungs no rhonchi or wheezing Cardiovascular regular rhythm no murmurs carotid pulses 3+ palpable no bruits audible Abdomen soft nontender no palpable masses Musculoskeletal free of  major deformities Skin clear -no rashes Neurologic normal Lower extremities 3+ femoral and dorsalis pedis pulses palpable bilaterally with no edema Left leg has a prominent vein beginning in the distal thigh extending across the popliteal fossa which is tender to touch. There is no active ulceration or hyperpigmentation lower extremity distally. Right leg has a prominent vein with some varicosities in the medial thigh anteriorly and medially above the knee. No active ulceration or hyperpigmentation or edema noted.  Assessment:     Varicose veins bilaterally left more symptomatic than right    Plan:     Plan bilateral lower extremity reflux venous study to determine if there is valvular incompetence in superficial system We'll begin long-leg elastic compression stockings 20-30 mm gradient as well as elevation and ibuprofen

## 2012-11-28 ENCOUNTER — Encounter (HOSPITAL_COMMUNITY): Payer: PRIVATE HEALTH INSURANCE

## 2012-11-30 ENCOUNTER — Other Ambulatory Visit: Payer: Self-pay | Admitting: Vascular Surgery

## 2012-11-30 DIAGNOSIS — I83893 Varicose veins of bilateral lower extremities with other complications: Secondary | ICD-10-CM

## 2012-12-05 ENCOUNTER — Encounter: Payer: Self-pay | Admitting: *Deleted

## 2012-12-05 ENCOUNTER — Ambulatory Visit (HOSPITAL_COMMUNITY)
Admission: RE | Admit: 2012-12-05 | Discharge: 2012-12-05 | Disposition: A | Discharge status: Home / Self Care | Payer: PRIVATE HEALTH INSURANCE | Source: Ambulatory Visit | Attending: Vascular Surgery | Admitting: Vascular Surgery

## 2012-12-05 DIAGNOSIS — I83893 Varicose veins of bilateral lower extremities with other complications: Secondary | ICD-10-CM | POA: Insufficient documentation

## 2012-12-05 NOTE — Progress Notes (Signed)
Patient ID: Toni Frank, female   DOB: 1975/12/10, 37 y.o.   MRN: [id number] Patient had a bilateral reflux study today which did show reflux in both GSV's but the vein diameters are too small therefore making laser ablation not an option at this time. Discussed this with the patient and gave her options re: sclerotherapy or lasers in the future. She is opting to wait and see if her condition worsens. Will follow prn and let Dr. Hart Rochester know the outcome of her duplex when he returns to the office.

## 2012-12-12 ENCOUNTER — Other Ambulatory Visit: Payer: Self-pay | Admitting: Obstetrics and Gynecology

## 2012-12-22 ENCOUNTER — Ambulatory Visit (INDEPENDENT_AMBULATORY_CARE_PROVIDER_SITE_OTHER): Payer: PRIVATE HEALTH INSURANCE | Admitting: Family Medicine

## 2012-12-22 ENCOUNTER — Encounter: Payer: Self-pay | Admitting: Family Medicine

## 2012-12-22 VITALS — BP 108/66 | HR 99 | Temp 98.6°F | Resp 18 | Wt 135.0 lb

## 2012-12-22 DIAGNOSIS — T7840XA Allergy, unspecified, initial encounter: Secondary | ICD-10-CM

## 2012-12-22 MED ORDER — EPINEPHRINE HCL 1 MG/ML IJ SOLN
0.1000 mg | Freq: Once | INTRAMUSCULAR | Status: AC
  Administered 2012-12-22 (×2): 0.1 mg via SUBCUTANEOUS

## 2012-12-22 MED ORDER — EPINEPHRINE HCL 1 MG/ML IJ SOLN: 0.2000 mg | Freq: Once | INTRAMUSCULAR | Status: AC

## 2012-12-22 MED ORDER — METHYLPREDNISOLONE ACETATE 80 MG/ML IJ SUSP
80.0000 mg | Freq: Once | INTRAMUSCULAR | Status: AC
  Administered 2012-12-22: 80 mg via INTRAMUSCULAR

## 2012-12-22 NOTE — Patient Instructions (Signed)
If you experience worsening symptoms or persistent symptoms after the next 4-5 hours, go directly to the emergency department for further evaluation and/or treatment

## 2012-12-22 NOTE — Progress Notes (Signed)
  Subjective:  This chart was scribed for Elvina Sidle, MD by Carl Best, Medical Scribe. This patient was seen in Room  and the patient's care was started at 9:20 AM.    Patient ID: Toni Frank, female    DOB: 08-14-1975, 37 y.o.   MRN: [id number]  HPI HPI Comments: Toni Frank is a 37 y.o. female who presents to the [facility] complaining of an allergic reaction  that started last night.  The patient lists shortness of breath upon standing up and itchy, hives, and nausea as associated symptoms.  She states that she feels like her throat is "thick".  The patient states that she took Benadryl last night with no relief in her symptoms.  She states that she takes Ritalin daily.  She denies taking Ritalin today.  She denies taking any new prescription medications.  She states that she has a history of allergic reactions with the last one occuring five years ago.  She states that she received a high dose of Epinephrine and Prednisone at the time of her last allergic reaction and she experienced diaphoresis and facial swelling.  She states that she has an epi pen but believes that the medication is expired.    Past Medical History  Diagnosis Date  . PVC (premature ventricular contraction)   . Allergy     acute - cats  . Amenorrhea   . Asthma   . ADD (attention deficit disorder with hyperactivity)   . IBS (irritable bowel syndrome)   . Kidney stones    Past Surgical History  Procedure Laterality Date  . Tubal ligation    . Gynecologic cryosurgery    . Spine surgery     History   Social History  . Marital Status: Married    Spouse Name: N/A    Number of Children: N/A  . Years of Education: N/A   Occupational History  . Not on file.   Social History Main Topics  . Smoking status: Former Games developer  . Smokeless tobacco: Not on file  . Alcohol Use: Not on file  . Drug Use: Not on file  . Sexual Activity: Not on file   Other Topics Concern  . Not  on file   Social History Narrative  . No narrative on file      Review of Systems     Objective:   Physical Exam No acute distress Chest: Clear Neck: Supple no adenopathy Oropharynx clear without swelling Skin: No rashes noted Extremities: No edema Neurologically, patient seems calm, gait is normal,  moving normally on exam area       Assessment & Plan:   Allergic reaction, initial encounter - Plan: EPINEPHrine (ADRENALIN) injection 0.1 mg, EPINEPHrine (ADRENALIN) injection 0.2 mg, methylPREDNISolone acetate (DEPO-MEDROL) injection 80 mg Patient told that if symptoms worsen or she develops new symptoms, or directly to the emergency room for further evaluation and/or treatment Signed, Elvina Sidle, MD

## 2012-12-24 ENCOUNTER — Telehealth: Payer: Self-pay | Admitting: Family Medicine

## 2012-12-24 DIAGNOSIS — Z0000 Encounter for general adult medical examination without abnormal findings: Secondary | ICD-10-CM

## 2012-12-24 MED ORDER — EPINEPHRINE 0.3 MG/0.3ML IJ SOAJ: 0.3000 mg | Freq: Once | INTRAMUSCULAR | Status: AC

## 2012-12-24 NOTE — Telephone Encounter (Signed)
Pt had an emergency / allergic reaction this weekend. It started w/ hives. Pt states she my have to carry that epi pen w/ her as was advised in the past. Pt would like to discuss this w/ you. pls call.

## 2012-12-24 NOTE — Telephone Encounter (Signed)
Rx for new epi-pen sent to pharmacy.

## 2013-02-07 IMAGING — MG MM DIGITAL DIAGNOSTIC BILAT
5 series · 5 of 5 positions shown · non-contrast
Comparison: None.

CLINICAL DATA: Palpable lump right breast

DIGITAL DIAGNOSTIC BILATERAL MAMMOGRAM WITH CAD AND RIGHT BREAST
ULTRASOUND:

[R CC]
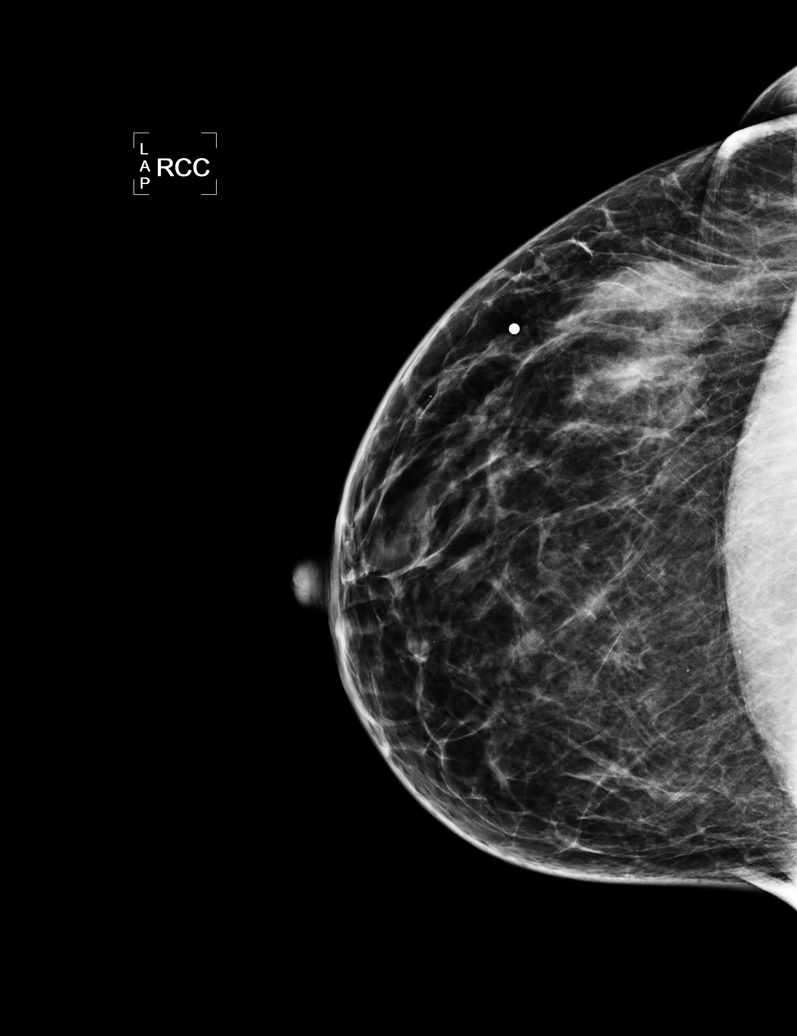

[L CC]
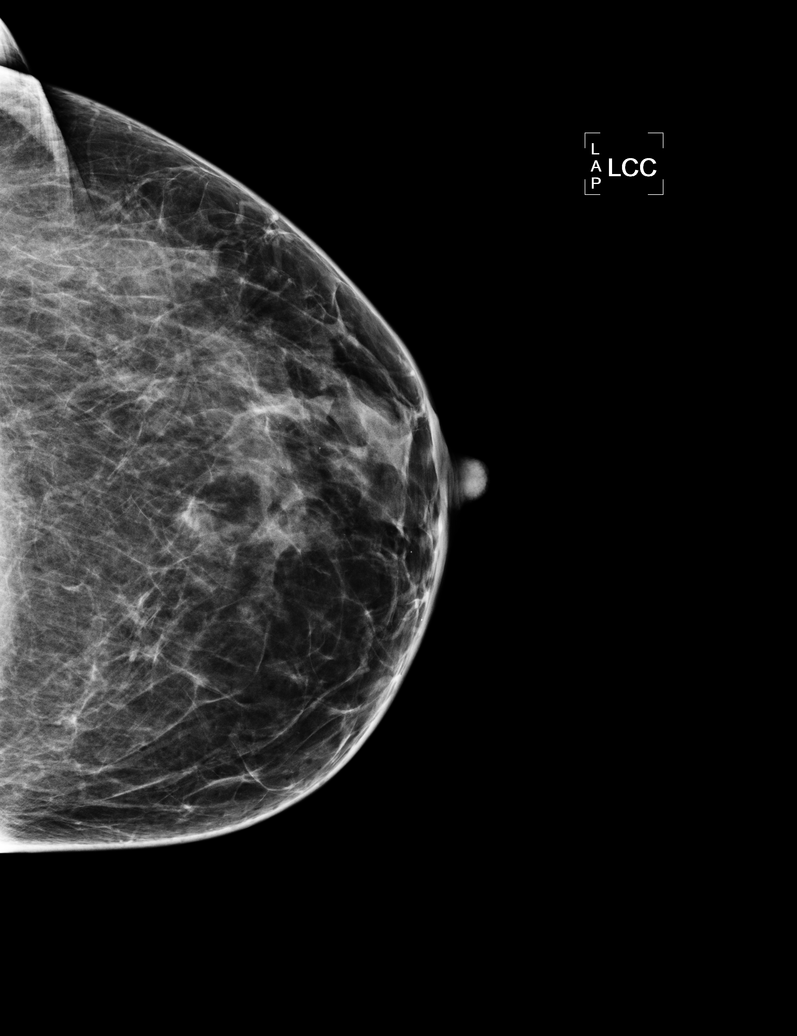

[L MLO]
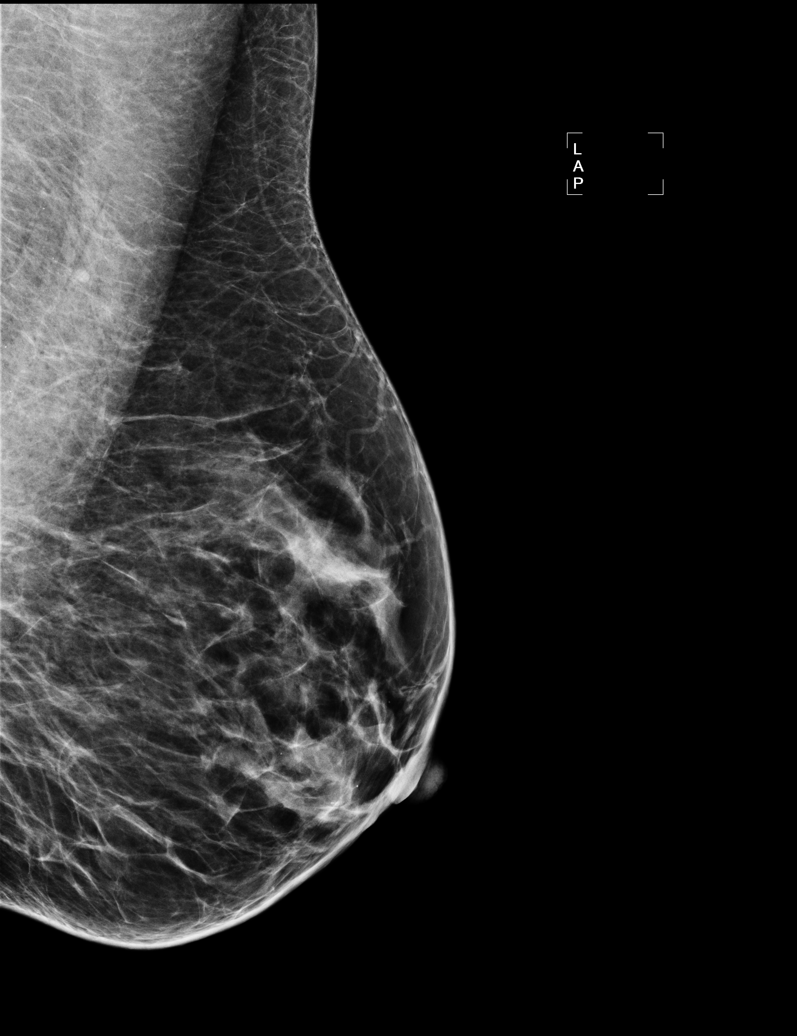

[R MLO]
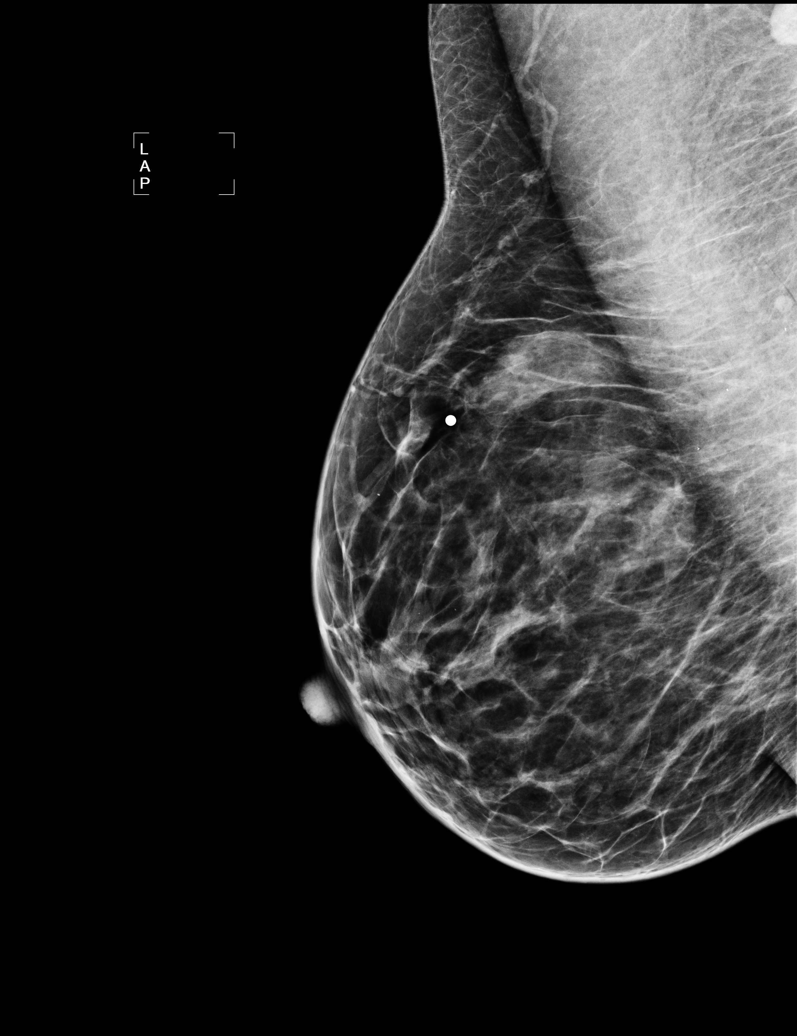

[R TAN]
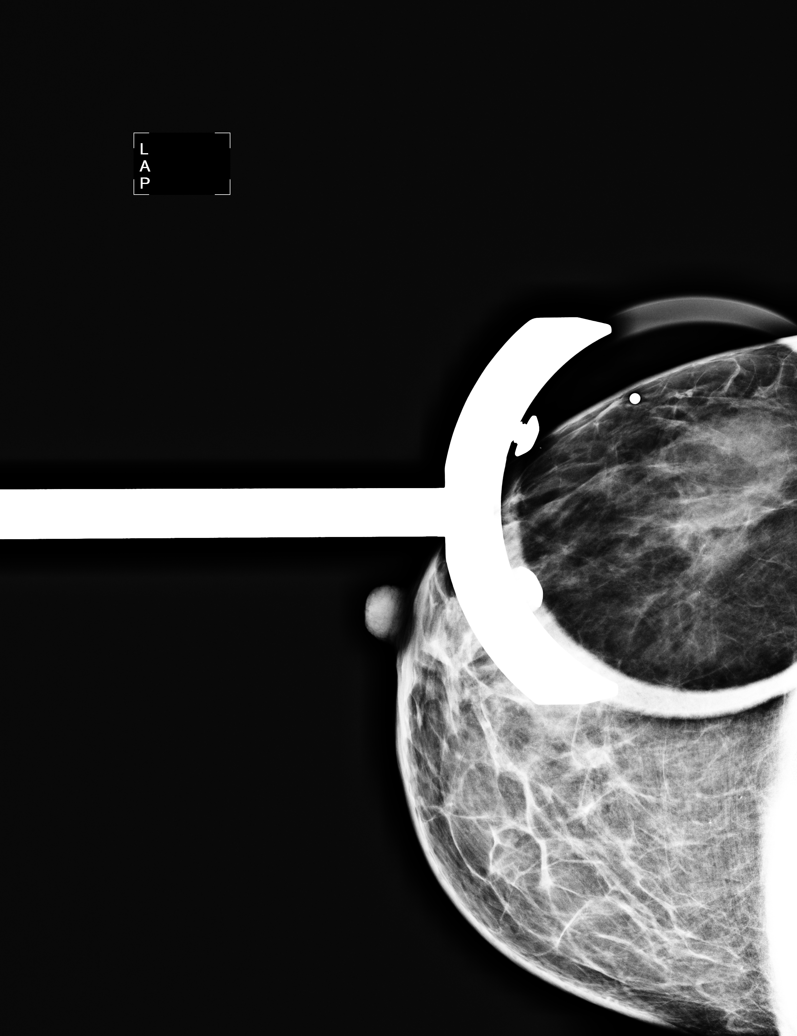

[5 of 5 positions shown; findings below may reference images not displayed]

FINDINGS: CC and MLO views of bilateral breasts, spot tangential
view of right breast are submitted.  No suspicious abnormality is
identified bilaterally.
Mammographic images were processed with CAD.

Ultrasound is performed, showing no focal discrete cystic or solid
lesion in the palpable area upper outer quadrant right breast.
IMPRESSION: Benign findings.

RECOMMENDATION:
Routine screening mammogram at age 40.

I have discussed the findings and recommendations with the patient.
Results were also provided in writing at the conclusion of the
visit.

BI-RADS CATEGORY 2:  Benign finding(s).

## 2013-03-12 ENCOUNTER — Telehealth: Payer: Self-pay | Admitting: Family Medicine

## 2013-03-12 MED ORDER — METHYLPHENIDATE HCL ER 20 MG PO TBCR: 20.0000 mg | EXTENDED_RELEASE_TABLET | Freq: Every day | ORAL | Status: DC

## 2013-03-12 NOTE — Telephone Encounter (Signed)
Pt is requesting rx for methylphenidate er 20 mg, please call when available for p/u.

## 2013-03-12 NOTE — Telephone Encounter (Signed)
Rx ready for pick up and patient is aware 

## 2013-04-29 ENCOUNTER — Encounter: Payer: Self-pay | Admitting: Family Medicine

## 2013-06-20 ENCOUNTER — Other Ambulatory Visit: Payer: Self-pay | Admitting: *Deleted

## 2013-06-20 MED ORDER — METHYLPHENIDATE HCL ER 20 MG PO TBCR: 20.0000 mg | EXTENDED_RELEASE_TABLET | Freq: Every day | ORAL | Status: DC

## 2013-06-20 NOTE — Telephone Encounter (Signed)
Rx ready for pick up and left message on machine

## 2013-07-16 ENCOUNTER — Ambulatory Visit (INDEPENDENT_AMBULATORY_CARE_PROVIDER_SITE_OTHER): Payer: PRIVATE HEALTH INSURANCE | Admitting: Family Medicine

## 2013-07-16 ENCOUNTER — Encounter: Payer: Self-pay | Admitting: Family Medicine

## 2013-07-16 VITALS — BP 120/80 | Temp 98.1°F | Ht 64.0 in | Wt 132.0 lb

## 2013-07-16 DIAGNOSIS — F909 Attention-deficit hyperactivity disorder, unspecified type: Secondary | ICD-10-CM

## 2013-07-16 DIAGNOSIS — Z0000 Encounter for general adult medical examination without abnormal findings: Secondary | ICD-10-CM

## 2013-07-16 LAB — CBC WITH DIFFERENTIAL/PLATELET
Basophils Absolute: 0 10*3/uL (ref 0.0–0.1)
Basophils Relative: 0.7 % (ref 0.0–3.0)
Eosinophils Absolute: 0 10*3/uL (ref 0.0–0.7)
Eosinophils Relative: 0.4 % (ref 0.0–5.0)
HCT: 39.7 % (ref 36.0–46.0)
Hemoglobin: 13.1 g/dL (ref 12.0–15.0)
Lymphocytes Relative: 43.2 % (ref 12.0–46.0)
Lymphs Abs: 2.3 10*3/uL (ref 0.7–4.0)
MCHC: 32.9 g/dL (ref 30.0–36.0)
MCV: 93.6 fl (ref 78.0–100.0)
Monocytes Absolute: 0.4 10*3/uL (ref 0.1–1.0)
Monocytes Relative: 7.9 % (ref 3.0–12.0)
Neutro Abs: 2.5 10*3/uL (ref 1.4–7.7)
Neutrophils Relative %: 47.8 % (ref 43.0–77.0)
Platelets: 297 10*3/uL (ref 150.0–400.0)
RBC: 4.24 Mil/uL (ref 3.87–5.11)
RDW: 13.1 % (ref 11.5–15.5)
WBC: 5.3 10*3/uL (ref 4.0–10.5)

## 2013-07-16 LAB — BASIC METABOLIC PANEL
BUN: 10 mg/dL (ref 6–23)
CHLORIDE: 105 meq/L (ref 96–112)
CO2: 28 meq/L (ref 19–32)
Calcium: 9.2 mg/dL (ref 8.4–10.5)
Creatinine, Ser: 0.7 mg/dL (ref 0.4–1.2)
GFR: 108.55 mL/min (ref >60.00)
GLUCOSE: 86 mg/dL (ref 70–99)
POTASSIUM: 4.3 meq/L (ref 3.5–5.1)
SODIUM: 139 meq/L (ref 135–145)

## 2013-07-16 LAB — POCT URINALYSIS DIPSTICK
BILIRUBIN UA: NEGATIVE
GLUCOSE UA: NEGATIVE
KETONES UA: NEGATIVE
Nitrite, UA: NEGATIVE
Protein, UA: NEGATIVE
Spec Grav, UA: 1.015
Urobilinogen, UA: 0.2
pH, UA: 8

## 2013-07-16 LAB — TSH: TSH: 0.71 u[IU]/mL (ref 0.35–4.50)

## 2013-07-16 NOTE — Progress Notes (Signed)
   Subjective:    Patient ID: Toni Frank, female    DOB: [birth year], 38 y.o.   MRN: [id number]  HPI Toni Frank is a 38 year old married female G3 P3......... 3 boys.Marland KitchenMarland KitchenMarland KitchenMarland Kitchen who comes in today for general physical examination because of a history of adult ADD  She's had ADD cyst for many many years. This started as a teenager. She's currently on methylphenidate 20 mg long-acting 1 daily  She gets routine eye care, dental care, BSE monthly, mammogram a couple years ago was normal. She's also had a colonoscopy for IBS no polyps.  For birth control she's had her tubes tied Pap smear December 2014 by GYN  Vaccinations up to date tetanus 2012   Review of Systems Review of systems otherwise negative    Objective:   Physical Exam  Well-developed well-nourished female no acute distress vital signs stable she's afebrile HEENT negative neck was supple no adenopathy lungs are clear thyroid normal cardiovascular exam normal breast exam normal abdominal exam normal total body skin exam normal      Assessment & Plan:  Healthy female  History of ADD continue methylphenidate 20 mg daily

## 2013-07-16 NOTE — Patient Instructions (Signed)
Continue your current medication  Call 2 weeks from being out of your third prescription for refills  Return in one year for general checkup sooner if any problems  Continue to wear the sunscreens,,,,,,,,,,,,,, good job

## 2013-09-11 ENCOUNTER — Telehealth: Payer: Self-pay | Admitting: Family Medicine

## 2013-09-11 NOTE — Telephone Encounter (Signed)
Pt request refill of the following: methylphenidate (METADATE ER) 20 MG ER tablet    Phamacy:  Pick up

## 2013-09-12 MED ORDER — METHYLPHENIDATE HCL ER 20 MG PO TBCR: 20.0000 mg | EXTENDED_RELEASE_TABLET | Freq: Every day | ORAL | Status: DC

## 2013-09-12 NOTE — Telephone Encounter (Signed)
Rx ready for pick up and Left message on machine for patient   

## 2013-12-16 ENCOUNTER — Ambulatory Visit (INDEPENDENT_AMBULATORY_CARE_PROVIDER_SITE_OTHER): Payer: PRIVATE HEALTH INSURANCE | Admitting: Family Medicine

## 2013-12-16 VITALS — BP 110/80 | Temp 98.4°F

## 2013-12-16 DIAGNOSIS — T7840XA Allergy, unspecified, initial encounter: Secondary | ICD-10-CM

## 2013-12-16 MED ORDER — METHYLPHENIDATE HCL ER 20 MG PO TBCR: 20.0000 mg | EXTENDED_RELEASE_TABLET | Freq: Every day | ORAL | Status: DC

## 2013-12-16 MED ORDER — PREDNISONE 20 MG PO TABS: ORAL_TABLET | ORAL | Status: DC

## 2013-12-16 NOTE — Patient Instructions (Signed)
Prednisone 20 mg............. 2 now.........Marland Kitchen 1 daily 3 days, half 3 days, then a half Monday Wednesday Friday for a 2 week taper

## 2013-12-16 NOTE — Progress Notes (Signed)
   Subjective:    Patient ID: Toni Frank, female    DOB: 1975-04-05, 38 y.o.   MRN: [id number]  HPI Toni Frank is a 38 year old married female nonsmoker who comes in today with acute allergic reaction  She's had a history of both nasal allergies and asthma in the past. She's had an immunologic workup in the past which shows severe sensitivity to dust mites. Last evening she was in the attic for about an hour and a half working in dust and noticed facial swelling and itching. She took some Benadryl but it hasn't completely resolved. No shortness of breath no wheezing   Review of Systems Review of systems otherwise negative    Objective:   Physical Exam Well-developed well-nourished female no acute distress shows slight erythema of her face along with the eyelids airway normal       Assessment & Plan:  ...Marland KitchenMarland KitchenMarland Kitchen Allergic reaction............. Prednisone burst and taper

## 2013-12-16 NOTE — Progress Notes (Signed)
Pre visit review using our clinic review tool, if applicable. No additional management support is needed unless otherwise documented below in the visit note. 

## 2013-12-26 ENCOUNTER — Other Ambulatory Visit: Payer: Self-pay | Admitting: Obstetrics and Gynecology

## 2013-12-30 LAB — CYTOLOGY - PAP

## 2014-04-14 ENCOUNTER — Telehealth: Payer: Self-pay | Admitting: Family Medicine

## 2014-04-14 NOTE — Telephone Encounter (Signed)
Pt request refill  methylphenidate (METADATE ER) 20 MG ER tablet 3 mo supply

## 2014-04-15 NOTE — Telephone Encounter (Signed)
Okay to fill 04/16/14

## 2014-04-16 MED ORDER — METHYLPHENIDATE HCL ER 20 MG PO TBCR: 20.0000 mg | EXTENDED_RELEASE_TABLET | Freq: Every day | ORAL | Status: DC

## 2014-04-16 NOTE — Telephone Encounter (Signed)
Rx ready for pick up.  Left message on machine for patient. 

## 2014-07-02 ENCOUNTER — Ambulatory Visit (INDEPENDENT_AMBULATORY_CARE_PROVIDER_SITE_OTHER): Payer: PRIVATE HEALTH INSURANCE | Admitting: Family Medicine

## 2014-07-02 ENCOUNTER — Encounter: Payer: Self-pay | Admitting: Family Medicine

## 2014-07-02 VITALS — BP 120/70 | Temp 99.2°F | Ht 64.5 in | Wt 139.0 lb

## 2014-07-02 DIAGNOSIS — F900 Attention-deficit hyperactivity disorder, predominantly inattentive type: Secondary | ICD-10-CM | POA: Diagnosis not present

## 2014-07-02 DIAGNOSIS — Z0000 Encounter for general adult medical examination without abnormal findings: Secondary | ICD-10-CM | POA: Diagnosis not present

## 2014-07-02 LAB — CBC WITH DIFFERENTIAL/PLATELET
BASOS ABS: 0 10*3/uL (ref 0.0–0.1)
Basophils Relative: 0.4 % (ref 0.0–3.0)
EOS ABS: 0 10*3/uL (ref 0.0–0.7)
Eosinophils Relative: 0.4 % (ref 0.0–5.0)
HEMATOCRIT: 36.3 % (ref 36.0–46.0)
Hemoglobin: 12.6 g/dL (ref 12.0–15.0)
Lymphocytes Relative: 40.4 % (ref 12.0–46.0)
Lymphs Abs: 2.2 10*3/uL (ref 0.7–4.0)
MCHC: 34.7 g/dL (ref 30.0–36.0)
MCV: 90.6 fl (ref 78.0–100.0)
Monocytes Absolute: 0.3 10*3/uL (ref 0.1–1.0)
Monocytes Relative: 6.3 % (ref 3.0–12.0)
Neutro Abs: 2.9 10*3/uL (ref 1.4–7.7)
Neutrophils Relative %: 52.5 % (ref 43.0–77.0)
Platelets: 268 10*3/uL (ref 150.0–400.0)
RBC: 4.01 Mil/uL (ref 3.87–5.11)
RDW: 12.9 % (ref 11.5–15.5)
WBC: 5.4 10*3/uL (ref 4.0–10.5)

## 2014-07-02 MED ORDER — METHYLPHENIDATE HCL ER 20 MG PO TBCR: 20.0000 mg | EXTENDED_RELEASE_TABLET | Freq: Every day | ORAL | Status: DC

## 2014-07-02 NOTE — Patient Instructions (Signed)
Continue current medication  Call Apolonio Schneiders at [phone] 2 weeks from being out of your third prescription for methylphenidate  Return next May for annual sooner if any problems  Dr. Remonia Richter or Dr. Yong Channel are 2 new young physicians

## 2014-07-02 NOTE — Progress Notes (Signed)
Pre visit review using our clinic review tool, if applicable. No additional management support is needed unless otherwise documented below in the visit note. 

## 2014-07-02 NOTE — Progress Notes (Signed)
   Subjective:    Patient ID: Toni Frank, female    DOB: 02/12/1975, 39 y.o.   MRN: [id number]  HPI Toni Frank is a 39 year old married female nonsmoker G3 P3......... children are 11, 7 in 5....... who comes in today for general physical examination  She has a history of ADD and is on methylphenidate 20 mg daily long-acting doing well.  She saw her GYN recently. She's on BCPs to control her periods I suggested she consider the IUD  She gets routine eye care, dental care, BSE monthly,  Vaccinations up-to-date tetanus 2012  Social history she recently purchased a house at [employer] she and the kids are gone spend the summer there   Review of Systems  Constitutional: Negative.   HENT: Negative.   Eyes: Negative.   Respiratory: Negative.   Cardiovascular: Negative.   Gastrointestinal: Negative.   Endocrine: Negative.   Genitourinary: Negative.   Musculoskeletal: Negative.   Skin: Negative.   Allergic/Immunologic: Negative.   Neurological: Negative.   Hematological: Negative.   Psychiatric/Behavioral: Negative.        Objective:   Physical Exam  Constitutional: She appears well-developed and well-nourished.  HENT:  Head: Normocephalic and atraumatic.  Right Ear: External ear normal.  Left Ear: External ear normal.  Nose: Nose normal.  Mouth/Throat: Oropharynx is clear and moist.  Eyes: EOM are normal. Pupils are equal, round, and reactive to light.  Neck: Normal range of motion. Neck supple. No JVD present. No tracheal deviation present. No thyromegaly present.  Cardiovascular: Normal rate, regular rhythm, normal heart sounds and intact distal pulses.  Exam reveals no gallop and no friction rub.   No murmur heard. Pulmonary/Chest: Effort normal and breath sounds normal. No stridor. No respiratory distress. She has no wheezes. She has no rales. She exhibits no tenderness.  Abdominal: Soft. Bowel sounds are normal. She exhibits no distension and no mass. There is no  tenderness. There is no rebound and no guarding.  Genitourinary:  Bilateral breast exam shows diffuse fibrocystic changes no dominant mass  Musculoskeletal: Normal range of motion.  Lymphadenopathy:    She has no cervical adenopathy.  Neurological: She is alert. She has normal reflexes. No cranial nerve deficit. She exhibits normal muscle tone. Coordination normal.  Skin: Skin is warm and dry. No rash noted. No erythema. No pallor.  Psychiatric: She has a normal mood and affect. Her behavior is normal. Judgment and thought content normal.          Assessment & Plan:  Healthy female  History of adult adult ADD continue methylphenidate  History of dysplastic nevi.......Marland Kitchen meticulous use of sunscreens

## 2014-07-22 ENCOUNTER — Ambulatory Visit (INDEPENDENT_AMBULATORY_CARE_PROVIDER_SITE_OTHER): Payer: PRIVATE HEALTH INSURANCE | Admitting: Family Medicine

## 2014-07-22 ENCOUNTER — Encounter: Payer: Self-pay | Admitting: Family Medicine

## 2014-07-22 VITALS — BP 98/70 | HR 83 | Temp 98.3°F | Ht 64.5 in | Wt 137.9 lb

## 2014-07-22 DIAGNOSIS — J989 Respiratory disorder, unspecified: Secondary | ICD-10-CM

## 2014-07-22 MED ORDER — AMOXICILLIN 875 MG PO TABS: 875.0000 mg | ORAL_TABLET | Freq: Two times a day (BID) | ORAL | Status: DC

## 2014-07-22 NOTE — Progress Notes (Signed)
Pre visit review using our clinic review tool, if applicable. No additional management support is needed unless otherwise documented below in the visit note. 

## 2014-07-22 NOTE — Progress Notes (Addendum)
HPI:  Cough and congestion: -started: 3 days ago -symptoms:nasal congestion, sore throat, cough - productive, fatigue, mild nausea, mild diarrhea - once -denies:fever, SOB, NVD, blood in stools -has tried: tylenol -sick contacts/travel/risks: denies flu exposure, tick exposure or or Ebola risks -reports son and husband had this and got an abx -Hx of: allergies  ROS: See pertinent positives and negatives per HPI.  Past Medical History  Diagnosis Date  . PVC (premature ventricular contraction)   . Allergy     acute - cats  . Amenorrhea   . Asthma   . ADD (attention deficit disorder with hyperactivity)   . IBS (irritable bowel syndrome)   . Kidney stones     Past Surgical History  Procedure Laterality Date  . Tubal ligation    . Gynecologic cryosurgery    . Spine surgery      Family History  Problem Relation Age of Onset  . Heart disease Other   . Cancer Other     liver  . Colon polyps Other   . GI problems Other     IBS    History   Social History  . Marital Status: Married    Spouse Name: N/A  . Number of Children: N/A  . Years of Education: N/A   Social History Main Topics  . Smoking status: Former Research scientist (life sciences)  . Smokeless tobacco: Not on file  . Alcohol Use: Not on file  . Drug Use: Not on file  . Sexual Activity: Not on file   Other Topics Concern  . None   Social History Narrative     Current outpatient prescriptions:  .  cetirizine (ZYRTEC) 10 MG tablet, Take 10 mg by mouth daily., Disp: , Rfl:  .  EPINEPHrine (EPIPEN) 0.3 mg/0.3 mL SOAJ injection, Inject 0.3 mLs (0.3 mg total) into the muscle once., Disp: 2 Device, Rfl: 3 .  methylphenidate (METADATE ER) 20 MG ER tablet, Take 1 tablet (20 mg total) by mouth daily., Disp: 30 tablet, Rfl: 0 .  methylphenidate (METADATE ER) 20 MG ER tablet, Take 1 tablet (20 mg total) by mouth daily., Disp: 30 tablet, Rfl: 0 .  methylphenidate (METADATE ER) 20 MG ER tablet, Take 1 tablet (20 mg total) by mouth  daily., Disp: 30 tablet, Rfl: 0 .  PRESCRIPTION MEDICATION, Nogestimate BCP, Disp: , Rfl:  .  amoxicillin (AMOXIL) 875 MG tablet, Take 1 tablet (875 mg total) by mouth 2 (two) times daily., Disp: 20 tablet, Rfl: 0  Current facility-administered medications:  .  EPINEPHrine (ADRENALIN) injection 0.2 mg, 0.2 mg, Intramuscular, Once, Robyn Haber, MD  EXAMDanley Danker Vitals:   07/22/14 1044  BP: 98/70  Pulse: 83  Temp: 98.3 F (36.8 C)    Body mass index is 23.31 kg/(m^2).  GENERAL: vitals reviewed and listed above, alert, oriented, appears well hydrated and in no acute distress  HEENT: atraumatic, conjunttiva clear, no obvious abnormalities on inspection of external nose and ears, normal appearance of ear canals and TMs, clear nasal congestion, mild post oropharyngeal erythema with PND, no tonsillar edema or exudate, no sinus TTP  NECK: no obvious masses on inspection  LUNGS: clear to auscultation bilaterally, no wheezes, rales or rhonchi, good air movement  CV: HRRR, no peripheral edema  MS: moves all extremities without noticeable abnormality  PSYCH: pleasant and cooperative, no obvious depression or anxiety  ASSESSMENT AND PLAN:  Discussed the following assessment and plan:  Respiratory illness - Plan: amoxicillin (AMOXIL) 875 MG tablet, DISCONTINUED: amoxicillin (AMOXIL)  875 MG tablet  -given HPI and exam findings today, a serious infection or illness is unlikely. We discussed potential etiologies, with VURI being most likely, and advised supportive care and monitoring. We discussed treatment side effects, likely course, antibiotic misuse, transmission, and signs of developing a serious illness. -she is insistent on abx as husband ans son had same and got abx - rx given incase developes sinus pain or worsening or not improving as expected, risks discussed -of course, we advised to return or notify a doctor immediately if symptoms worsen or persist or new concerns  arise.    Patient Instructions  INSTRUCTIONS FOR UPPER RESPIRATORY INFECTION:  -plenty of rest and fluids  -nasal saline wash 2-3 times daily (use prepackaged nasal saline or bottled/distilled water if making your own)   -can use AFRIN nasal spray for drainage and nasal congestion - but do NOT use longer then 3-4 days  -can use tylenol (in no history of liver disease) or ibuprofen (if no history of kidney disease, bowel bleeding or significant heart disease) as directed for aches and sorethroat  -in the winter time, using a humidifier at night is helpful (please follow cleaning instructions)  -if you are taking a cough medication - use only as directed, may also try a teaspoon of honey to coat the throat and throat lozenges. If given a cough medication with codeine or hydrocodone or other narcotic please be advised that this contains a strong and  potentially addicting medication. Please follow instructions carefully, take as little as possible and only use AS NEEDED for severe cough. Discuss potential side effects with your pharmacy. Please do not drive or operate machinery while taking these types of medications. Please do not take other sedating medications, drugs or alcohol while taking this medication without discussing with your doctor.  -for sore throat, salt water gargles can help  -follow up if you have fevers, facial pain, tooth pain, difficulty breathing or are worsening or symptoms persist longer then expected  Upper Respiratory Infection, Adult An upper respiratory infection (URI) is also known as the common cold. It is often caused by a type of germ (virus). Colds are easily spread (contagious). You can pass it to others by kissing, coughing, sneezing, or drinking out of the same glass. Usually, you get better in 1 to 3  weeks.  However, the cough can last for even longer. HOME CARE   Only take medicine as told by your doctor. Follow instructions provided above.  Drink  enough water and fluids to keep your pee (urine) clear or pale yellow.  Get plenty of rest.  Return to work when your temperature is < 100 for 24 hours or as told by your doctor. You may use a face mask and wash your hands to stop your cold from spreading. GET HELP RIGHT AWAY IF:   After the first few days, you feel you are getting worse.  You have questions about your medicine.  You have chills, shortness of breath, or red spit (mucus).  You have pain in the face for more then 1-2 days, especially when you bend forward.  You have a fever, puffy (swollen) neck, pain when you swallow, or white spots in the back of your throat.  You have a bad headache, ear pain, sinus pain, or chest pain.  You have a high-pitched whistling sound when you breathe in and out (wheezing).  You cough up blood.  You have sore muscles or a stiff neck. MAKE SURE YOU:  Understand these instructions.  Will watch your condition.  Will get help right away if you are not doing well or get worse. Document Released: 07/13/2007 Document Revised: 04/18/2011 Document Reviewed: 05/01/2013 [facility] Patient Information 2015 [place], [place]. This information is not intended to replace advice given to you by your health care provider. Make sure you discuss any questions you have with your health care provider.      Colin Benton R.

## 2014-07-22 NOTE — Patient Instructions (Signed)

## 2014-11-04 ENCOUNTER — Other Ambulatory Visit: Payer: Self-pay | Admitting: Family Medicine

## 2014-11-04 MED ORDER — METHYLPHENIDATE HCL ER 20 MG PO TBCR: 20.0000 mg | EXTENDED_RELEASE_TABLET | Freq: Every day | ORAL | Status: DC

## 2014-11-04 NOTE — Telephone Encounter (Signed)
Left a message that rx are ready for pick up.

## 2014-11-04 NOTE — Telephone Encounter (Signed)
° ° °

## 2015-01-06 ENCOUNTER — Telehealth: Payer: Self-pay | Admitting: Cardiovascular Disease

## 2015-01-06 NOTE — Telephone Encounter (Signed)
Received records from [place] for appointment on 01/23/15 with Dr Oval Linsey.  Records given to [facility] (medical records) for Dr Blenda Mounts schedule on 01/23/15. lp

## 2015-01-23 ENCOUNTER — Telehealth: Payer: Self-pay | Admitting: Family Medicine

## 2015-01-23 ENCOUNTER — Ambulatory Visit: Payer: PRIVATE HEALTH INSURANCE | Admitting: Cardiovascular Disease

## 2015-01-23 NOTE — Telephone Encounter (Signed)
° ° °

## 2015-01-26 MED ORDER — METHYLPHENIDATE HCL ER 20 MG PO TBCR
20.0000 mg | EXTENDED_RELEASE_TABLET | Freq: Every day | ORAL | Status: DC
Start: 2015-01-26 — End: 2015-06-10

## 2015-01-26 MED ORDER — METHYLPHENIDATE HCL ER 20 MG PO TBCR: 20.0000 mg | EXTENDED_RELEASE_TABLET | Freq: Every day | ORAL | Status: DC

## 2015-01-26 NOTE — Telephone Encounter (Signed)
Rx ready for pick up and patient is aware 

## 2015-01-29 ENCOUNTER — Ambulatory Visit (INDEPENDENT_AMBULATORY_CARE_PROVIDER_SITE_OTHER): Payer: PRIVATE HEALTH INSURANCE | Admitting: Cardiovascular Disease

## 2015-01-29 ENCOUNTER — Ambulatory Visit (INDEPENDENT_AMBULATORY_CARE_PROVIDER_SITE_OTHER): Payer: PRIVATE HEALTH INSURANCE

## 2015-01-29 ENCOUNTER — Encounter: Payer: Self-pay | Admitting: Cardiovascular Disease

## 2015-01-29 DIAGNOSIS — R002 Palpitations: Secondary | ICD-10-CM

## 2015-01-29 NOTE — Assessment & Plan Note (Signed)
Toni Frank was referred to me for evaluation of symptomatic palpitations. These began initially in the third trimester of her last pregnancy 6 years ago and then resolved spontaneously. They resumed approximately 2-3 months ago and occurred on a daily basis. 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 She drinks one cup of caffeine beverages in the morning. There is questionable history of PVCs and her 12-lead EKG shows isolated unifocal PVCs. I'm going to get a 2-D echocardiogram and a 30 day event monitor and I will see her back after that for further evaluation.

## 2015-01-29 NOTE — Patient Instructions (Signed)
Medication Instructions:  Your physician recommends that you continue on your current medications as directed. Please refer to the Current Medication list given to you today.   Labwork: none  Testing/Procedures: Your physician has requested that you have an echocardiogram. Echocardiography is a painless test that uses sound waves to create images of your heart. It provides your doctor with information about the size and shape of your heart and how well your heart's chambers and valves are working. This procedure takes approximately one hour. There are no restrictions for this procedure.  Your physician has recommended that you wear an event monitor. Event monitors are medical devices that record the heart's electrical activity. Doctors most often [place] these monitors to diagnose arrhythmias. Arrhythmias are problems with the speed or rhythm of the heartbeat. The monitor is a small, portable device. You can wear one while you do your normal daily activities. This is usually used to diagnose what is causing palpitations/syncope (passing out).    Follow-Up: Your physician recommends that you schedule a follow-up appointment in: 6-8 weeks with Dr. Gwenlyn Found.   Any Other Special Instructions Will Be Listed Below (If Applicable).     If you need a refill on your cardiac medications before your next appointment, please call your pharmacy.

## 2015-01-29 NOTE — Progress Notes (Signed)
01/29/2015 Toni Frank   March 13, 1975  [id number]  Primary Physician TODD,JEFFREY Zenia Resides, MD Primary Cardiologist: Lorretta Harp MD Renae Gloss   HPI:  [name] is a delightful 39 year old infant appearing married Caucasian female mother of 3 children who works as a Programmer, multimedia. She was referred by Dr. Stann Mainland , her OB/GYN, for symptomatic palpitations. She otherwise has no cardiac risk factors. She had first onset of palpitations in the third trimester of her most recent pregnancy 6 years ago that spontaneously resolved until recently. She began to notice recurrent palpitations in October which are now daily. She has had some dizziness involved. She is on hormone replacement therapy for perimenopause and drinks one cup of caffeine beverages each day.   Current Outpatient Prescriptions  Medication Sig Dispense Refill  . cetirizine (ZYRTEC) 10 MG tablet Take 10 mg by mouth daily.    Marland Kitchen EPINEPHrine (EPIPEN) 0.3 mg/0.3 mL SOAJ injection Inject 0.3 mLs (0.3 mg total) into the muscle once. 2 Device 3  . Levonorgestrel-Ethinyl Estradiol (AMETHIA,CAMRESE) 0.1-0.02 & 0.01 MG tablet Take 1 tablet by mouth daily.    . methylphenidate (METADATE ER) 20 MG ER tablet Take 1 tablet (20 mg total) by mouth daily. 30 tablet 0  . PRESCRIPTION MEDICATION Nogestimate BCP     Current Facility-Administered Medications  Medication Dose Route Frequency Provider Last Rate Last Dose  . EPINEPHrine (ADRENALIN) injection 0.2 mg  0.2 mg Intramuscular Once Robyn Haber, MD        Allergies  Allergen Reactions  . Cortisone Other (See Comments)    unknown  . Sulfonamide Derivatives     REACTION: hives    Social History   Social History  . Marital Status: Married    Spouse Name: N/A  . Number of Children: N/A  . Years of Education: N/A   Occupational History  . Not on file.   Social History Main Topics  . Smoking status: Former Research scientist (life sciences)  . Smokeless tobacco: Never  Used  . Alcohol Use: Not on file  . Drug Use: No  . Sexual Activity: Not on file   Other Topics Concern  . Not on file   Social History Narrative     Review of Systems: General: negative for chills, fever, night sweats or weight changes.  Cardiovascular: negative for chest pain, dyspnea on exertion, edema, orthopnea, palpitations, paroxysmal nocturnal dyspnea or shortness of breath Dermatological: negative for rash Respiratory: negative for cough or wheezing Urologic: negative for hematuria Abdominal: negative for nausea, vomiting, diarrhea, bright red blood per rectum, melena, or hematemesis Neurologic: negative for visual changes, syncope, or dizziness All other systems reviewed and are otherwise negative except as noted above.    Blood pressure 120/80, pulse 78, height 5\' 4"  (1.626 m), weight 142 lb (64.411 kg).  General appearance: alert and no distress Neck: no adenopathy, no carotid bruit, no JVD, supple, symmetrical, trachea midline and thyroid not enlarged, symmetric, no tenderness/mass/nodules Lungs: clear to auscultation bilaterally Heart: regular rate and rhythm, S1, S2 normal, no murmur, click, rub or gallop Extremities: extremities normal, atraumatic, no cyanosis or edema  EKG normal sinus rhythm at 78 without ST or T-wave changes. Her rhythm strip that shows occasional unifocal PVCs.  ASSESSMENT AND PLAN:   Palpitations Mr. Toni Frank was referred to me for evaluation of symptomatic palpitations. These began initially in the third trimester of her last pregnancy 6 years ago and then resolved spontaneously. They resumed approximately 2-3 months ago and occurred on a  daily basis. 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 She drinks one cup of caffeine beverages in the morning. There is questionable history of PVCs and her 12-lead EKG shows isolated unifocal PVCs. I'm going to get a 2-D echocardiogram and a 30 day event monitor and I  will see her back after that for further evaluation.      Lorretta Harp MD FACP,FACC,FAHA, [facility] 01/29/2015 10:39 AM

## 2015-02-11 ENCOUNTER — Ambulatory Visit (HOSPITAL_COMMUNITY): Payer: PRIVATE HEALTH INSURANCE | Attending: Cardiology

## 2015-02-11 ENCOUNTER — Telehealth: Payer: Self-pay | Admitting: Cardiovascular Disease

## 2015-02-11 ENCOUNTER — Other Ambulatory Visit: Payer: Self-pay

## 2015-02-11 DIAGNOSIS — I071 Rheumatic tricuspid insufficiency: Secondary | ICD-10-CM | POA: Insufficient documentation

## 2015-02-11 DIAGNOSIS — I340 Nonrheumatic mitral (valve) insufficiency: Secondary | ICD-10-CM | POA: Diagnosis not present

## 2015-02-11 DIAGNOSIS — R002 Palpitations: Secondary | ICD-10-CM | POA: Insufficient documentation

## 2015-02-11 NOTE — Telephone Encounter (Signed)
Outgoing call. Discussed PVCs w/ patient on weekly cardionet reports. Advised to call if lightheadedness returns. Patient concerns addressed - explained PVCs shown, not A Fib - She was at other office for echo and stated she had to end call but would call if further concerns.

## 2015-02-11 NOTE — Telephone Encounter (Signed)
Pt called in stating that yesterday morning she had an episode to where she had heart racing and she was extremely lightheaded. She would like to know if the monitor picked up anything from that episode. Please f/u with her  Thanks

## 2015-02-12 ENCOUNTER — Telehealth: Payer: Self-pay | Admitting: *Deleted

## 2015-02-12 NOTE — Telephone Encounter (Signed)
Pt called back. She had echo yesterday - read by Dr. Marlou Porch. Dr. Kennon Holter notes pending.  Also has event monitor started on 12/22. She was calling again also regarding her palpitations - had called yesterday to see if we were getting monitor alerts when she was submitted symptoms to cardionet.  I had acknowledged that we had the weekly downloads but there were no urgent alerts sent.  She reports submitting symptom recordings several times a day, every day.  Pt is very insistent on trying to figure out what is going on with her palpitations. I explained that if there is an urgent alert (i.e. Atrial fibrillation, flutter), we get a timely notification.  States she had "a very bad day on Tuesday", she was lightheaded & palps were very noticeable. This was improved yesterday and not present today.   She is inquiring about when the echo results will be ready. Informed pt I would defer to Dr. Gwenlyn Found on this & if further recommendations advised.

## 2015-02-13 NOTE — Telephone Encounter (Signed)
2D echo nl. Event monitor in progress

## 2015-03-27 ENCOUNTER — Ambulatory Visit (INDEPENDENT_AMBULATORY_CARE_PROVIDER_SITE_OTHER): Payer: PRIVATE HEALTH INSURANCE | Admitting: Cardiovascular Disease

## 2015-03-27 ENCOUNTER — Encounter: Payer: Self-pay | Admitting: Cardiovascular Disease

## 2015-03-27 VITALS — BP 116/84 | HR 76 | Ht 64.0 in | Wt 144.0 lb

## 2015-03-27 DIAGNOSIS — R002 Palpitations: Secondary | ICD-10-CM | POA: Diagnosis not present

## 2015-03-27 NOTE — Assessment & Plan Note (Signed)
History of palpitations with recent 2-D echo that was entirely normal and event monitor that showed PVCs which resolved after January. Probably related to changing her birth control pills. Return when necessary

## 2015-03-27 NOTE — Patient Instructions (Signed)
Medication Instructions:  Your physician recommends that you continue on your current medications as directed. Please refer to the Current Medication list given to you today.   Labwork: none  Testing/Procedures: none  Follow-Up: Follow up with Dr. Berry as needed.   Any Other Special Instructions Will Be Listed Below (If Applicable).     If you need a refill on your cardiac medications before your next appointment, please call your pharmacy.   

## 2015-03-27 NOTE — Progress Notes (Signed)
Pella returns today for follow-up of evaluation for operations. A 2-D echo was normal. An event monitor showed unifocal PVCs that spontaneously resolved after January 3. She believes this is related to changing her birth control pills. No further evaluation is required at this time. I will see her back as needed.

## 2015-06-09 ENCOUNTER — Telehealth: Payer: Self-pay | Admitting: Family Medicine

## 2015-06-09 NOTE — Telephone Encounter (Signed)
Pt needs new rx methylphenidate 20 mg °

## 2015-06-10 MED ORDER — METHYLPHENIDATE HCL ER 20 MG PO TBCR: 20.0000 mg | EXTENDED_RELEASE_TABLET | Freq: Every day | ORAL | Status: DC

## 2015-06-10 NOTE — Telephone Encounter (Signed)
Left message on machine for patient that rx ready for pick up. 

## 2015-11-10 ENCOUNTER — Telehealth: Payer: Self-pay | Admitting: Family Medicine

## 2015-11-10 NOTE — Telephone Encounter (Signed)
Pt needs new methylphenidate 20 mg

## 2015-11-11 ENCOUNTER — Other Ambulatory Visit: Payer: Self-pay | Admitting: Emergency Medicine

## 2015-11-11 MED ORDER — METHYLPHENIDATE HCL ER 20 MG PO TBCR: 20.0000 mg | EXTENDED_RELEASE_TABLET | Freq: Every day | ORAL | 0 refills | Status: DC

## 2015-11-11 NOTE — Telephone Encounter (Signed)
Called and left voicemail on personal voicemail. Informing pt that prescriptions have been printed and up front ready for pick up. Per Dr.Todd appointment is needed sometime this fall for further refills.

## 2015-12-29 ENCOUNTER — Ambulatory Visit (INDEPENDENT_AMBULATORY_CARE_PROVIDER_SITE_OTHER): Payer: PRIVATE HEALTH INSURANCE | Admitting: Family Medicine

## 2015-12-29 ENCOUNTER — Encounter: Payer: Self-pay | Admitting: Family Medicine

## 2015-12-29 DIAGNOSIS — B9789 Other viral agents as the cause of diseases classified elsewhere: Secondary | ICD-10-CM | POA: Diagnosis not present

## 2015-12-29 DIAGNOSIS — J069 Acute upper respiratory infection, unspecified: Secondary | ICD-10-CM | POA: Diagnosis not present

## 2015-12-29 DIAGNOSIS — J4521 Mild intermittent asthma with (acute) exacerbation: Secondary | ICD-10-CM | POA: Diagnosis not present

## 2015-12-29 MED ORDER — PREDNISONE 20 MG PO TABS: ORAL_TABLET | ORAL | 1 refills | Status: DC

## 2015-12-29 MED ORDER — HYDROCODONE-HOMATROPINE 5-1.5 MG/5ML PO SYRP: 5.0000 mL | ORAL_SOLUTION | Freq: Three times a day (TID) | ORAL | 0 refills | Status: DC | PRN

## 2015-12-29 NOTE — Patient Instructions (Signed)
Prednisone 20 mg...Marland KitchenMarland KitchenMarland Kitchen 1 tab for 5 days, half a tab 5 days, then a half a tab Monday Wednesday Friday for a three-week taper  Once daily at bedtime.......... saline nasal spray...Marland KitchenMarland KitchenMarland Kitchen Afrin nasal spray (5 night limit).... One shot up each nostril......Marland Kitchen kilter head back over the pillow for 10 minutes........ then one shot up each nostril of the steroid nasal spray. Do this until your well  Remember the Afrin has a five-day limit  Hydromet......Marland Kitchen 1/2-1 teaspoon at bedtime when necessary for nighttime cough

## 2015-12-29 NOTE — Progress Notes (Signed)
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 On Sunday she used the med cost video conference call. Without examining her they gave her antibiotics. She was given amoxicillin 875 mg twice a day for 1 week. The antibiotics did no good. The reason they did no good is because she has a viral infection with underlying allergic rhinitis. Antibiotics are not indicated. This is an abuse of antibiotics and is one of the reason why we have antibiotic resistant bacteria on the exponential rise.  She has a history of underlying allergic rhinitis and takes Zyrtec on a regular basis. At one time she had asthma when she had a bad viral infection. She doesn't feel like she is wheezing now. She has had congestion cough and pressure in her ears but no pain. She is afebrile.  Physical examination weight 133 temp 99.3 pulse 70 regular BP 122/78  Examination HEENT shows marked bilateral nasal edema bilateral serous otitis media. Neck was supple no adenopathy lungs are clear except for wheezing on forced expiration.  Impression viral syndrome with underlying allergic rhinitis  Plan........ saline nasal spray......Marland Kitchen Afrin nasal spray.... 5 night limit... And steroid nasal spray at bedtime  Hydromet 1/2-1 teaspoon at bedtime. For cough  Prednisone 20 mg.... 2 tabs for 3 days and taper as outlined

## 2016-01-04 ENCOUNTER — Telehealth: Payer: Self-pay | Admitting: Family Medicine

## 2016-01-04 NOTE — Telephone Encounter (Signed)
Spoke with Toni Frank and she states that she has been taking prednisone as directed, and does feel some better. However she still has a lot of thick green mucus coming from her sinuses. She has sinus pain/pressure/headache and if she changes positions often feels dizzy and sees stars. Toni Frank denies f/n/v. Toni Frank also reports increased fluid/sull feeling in her ears and they often pop when she blows her nose. Toni Frank also has pain in her teeth.  Toni Frank does not want amoxicillin.   Dr. Sherren Mocha - Please advise. Thanks!

## 2016-01-04 NOTE — Telephone Encounter (Signed)
Pt was last seen on 12/29/15 and does not want to make another appt. Pt would like med etc send to [facility].please advise

## 2016-01-05 MED ORDER — DOXYCYCLINE HYCLATE 100 MG PO CAPS
100.0000 mg | ORAL_CAPSULE | Freq: Two times a day (BID) | ORAL | 0 refills | Status: AC
Start: 2016-01-05 — End: 2016-01-15

## 2016-01-05 NOTE — Telephone Encounter (Signed)
Per Dr Sherren Mocha - Doxycycline 100mg , BID, #20  LM on vm for pt that rx is being sent to pharmacy. Nothing further needed.

## 2016-01-19 ENCOUNTER — Other Ambulatory Visit: Payer: Self-pay | Admitting: Obstetrics & Gynecology

## 2016-01-20 LAB — CYTOLOGY - PAP

## 2016-02-19 ENCOUNTER — Telehealth: Payer: Self-pay | Admitting: Family Medicine

## 2016-02-19 NOTE — Telephone Encounter (Signed)
Pt need new Rx for methylphenidate   Pt is aware of the 3 business days for refills and a call to her when it is ready for pick-up/ltd

## 2016-02-22 MED ORDER — METHYLPHENIDATE HCL ER 20 MG PO TBCR: 20.0000 mg | EXTENDED_RELEASE_TABLET | Freq: Every day | ORAL | 0 refills | Status: DC

## 2016-02-22 NOTE — Telephone Encounter (Signed)
Spoke with pt regarding rx ready for pickup

## 2016-02-23 ENCOUNTER — Other Ambulatory Visit: Payer: Self-pay | Admitting: Emergency Medicine

## 2016-02-23 MED ORDER — METHYLPHENIDATE HCL ER 20 MG PO TBCR: 20.0000 mg | EXTENDED_RELEASE_TABLET | Freq: Every day | ORAL | 0 refills | Status: DC

## 2016-05-30 ENCOUNTER — Telehealth: Payer: Self-pay | Admitting: Family Medicine

## 2016-05-30 NOTE — Telephone Encounter (Signed)
Pt request refill  methylphenidate (METADATE ER) 20 MG ER tablet  Pt states she is out and needs asap.  Pt has cpe in May15 so a 30 day wil get her through to then

## 2016-05-30 NOTE — Telephone Encounter (Signed)
Dr. Sherren Mocha out of the office this week.  Will send to [facility] to see if he is willing to fill for 30 days.

## 2016-05-31 MED ORDER — METHYLPHENIDATE HCL ER 20 MG PO TBCR: 20.0000 mg | EXTENDED_RELEASE_TABLET | Freq: Every day | ORAL | 0 refills | Status: DC

## 2016-05-31 NOTE — Telephone Encounter (Signed)
Ok to refill for 30 days  

## 2016-05-31 NOTE — Telephone Encounter (Signed)
Printed for Cory to sign. 

## 2016-05-31 NOTE — Telephone Encounter (Signed)
Pt notified to pick up at the front desk. 

## 2016-06-21 ENCOUNTER — Ambulatory Visit (INDEPENDENT_AMBULATORY_CARE_PROVIDER_SITE_OTHER): Payer: PRIVATE HEALTH INSURANCE | Admitting: Family Medicine

## 2016-06-21 ENCOUNTER — Encounter: Payer: Self-pay | Admitting: Family Medicine

## 2016-06-21 VITALS — BP 120/60 | Temp 98.5°F | Ht 64.5 in | Wt 135.0 lb

## 2016-06-21 DIAGNOSIS — F900 Attention-deficit hyperactivity disorder, predominantly inattentive type: Secondary | ICD-10-CM | POA: Diagnosis not present

## 2016-06-21 DIAGNOSIS — Z0000 Encounter for general adult medical examination without abnormal findings: Secondary | ICD-10-CM | POA: Diagnosis not present

## 2016-06-21 MED ORDER — METHYLPHENIDATE HCL ER 20 MG PO TBCR: 20.0000 mg | EXTENDED_RELEASE_TABLET | Freq: Every day | ORAL | 0 refills | Status: DC

## 2016-06-21 NOTE — Progress Notes (Signed)
Pre visit review using our clinic review tool, if applicable. No additional management support is needed unless otherwise documented below in the visit note. 

## 2016-06-21 NOTE — Patient Instructions (Signed)
Leave [place] some message on my chart to 3 weeks from being out of your Adderall for refills  Return in one year for general checkup sooner if any problems

## 2016-06-21 NOTE — Progress Notes (Signed)
Toni Frank is a 41 year old married female G3 P3 nonsmoking person who comes in today for a new physical examination  She has a history of adult ADD is on Adderall 20 mg daily  She gets routine eye care, dental care, BSE monthly at home, will be starting a new mammography at her GYN office since she is 17.  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  She takes Zyrtec for allergic rhinitis  14 point review of systems June otherwise negative.vs BP 120/60 (BP Location: Right Arm)   Temp 98.5 F (36.9 C) (Oral)   Ht 5' 4.5" (1.638 m)   Wt 135 lb (61.2 kg)   LMP 06/13/2016 (Exact Date)   BMI 22.81 kg/m  Examination HEENT were negative neck was supple thyroid not enlarged cardiopulmonary exam normal abdominal exam normal pelvic and rectal done by GYN therefore deferred extremities normal skin normal peripheral pulses normal. Except for lesion on the ventral surface of her right foot. It's a wart it's been treated by the dermatologist.  #1 healthy female  #2 adult ADD,,,,,,,,, continue Adderall

## 2016-07-05 ENCOUNTER — Encounter: Payer: PRIVATE HEALTH INSURANCE | Admitting: Family Medicine

## 2016-10-07 ENCOUNTER — Telehealth: Payer: Self-pay | Admitting: Family Medicine

## 2016-10-07 NOTE — Telephone Encounter (Signed)
° ° °  Pt request refill of the following:   methylphenidate (METADATE ER) 20 MG ER tablet  Phamacy:

## 2016-10-11 NOTE — Telephone Encounter (Signed)
Pt is requesting for refills on her methylphenidate 20 mg, medication was filled on 06/21/2016. Pt last OV was 06/21/2016, pt is due for physical left a message for her to call our office and schedule a physical. Please advise if ok to refill.

## 2016-10-12 ENCOUNTER — Telehealth: Payer: Self-pay

## 2016-10-12 ENCOUNTER — Other Ambulatory Visit: Payer: Self-pay | Admitting: Family Medicine

## 2016-10-12 MED ORDER — METHYLPHENIDATE HCL ER 20 MG PO TBCR: 20.0000 mg | EXTENDED_RELEASE_TABLET | Freq: Every day | ORAL | 0 refills | Status: DC

## 2016-10-12 NOTE — Telephone Encounter (Signed)
Rx has been signed and  Ready for pick up, called pt  Left a message to return my call in the office.

## 2016-10-13 ENCOUNTER — Telehealth: Payer: Self-pay

## 2016-10-13 NOTE — Telephone Encounter (Signed)
Pt returned my phone call and is aware that the Rx is waiting for pick up in the front desk. Pt stated that she was on her way to the clinic to pick up the Rx.

## 2016-10-19 NOTE — Telephone Encounter (Signed)
Toni Frank please refill her Adderall for 3 months

## 2016-10-25 NOTE — Telephone Encounter (Signed)
Rx was printed signed and pt picked up at the front desk.

## 2016-11-07 ENCOUNTER — Telehealth: Payer: Self-pay | Admitting: *Deleted

## 2016-11-07 NOTE — Telephone Encounter (Signed)
Patient called with a question regarding a tick bite, patient states she was bit recently and she had some redness, and within the last 4 weeks her body has been achy and esp in the mornings, patient said the bite is not red anymore but itchy and she can still see the bite.. Please advise [phone]

## 2016-11-08 NOTE — Telephone Encounter (Signed)
Pt has been scheduled with Dr Maudie Mercury on 11/10/2016 at 4 pm.

## 2016-11-10 ENCOUNTER — Ambulatory Visit (INDEPENDENT_AMBULATORY_CARE_PROVIDER_SITE_OTHER): Payer: PRIVATE HEALTH INSURANCE | Admitting: Family Medicine

## 2016-11-10 ENCOUNTER — Encounter: Payer: Self-pay | Admitting: Family Medicine

## 2016-11-10 VITALS — BP 112/78 | HR 81 | Temp 98.8°F | Ht 64.5 in | Wt 141.5 lb

## 2016-11-10 DIAGNOSIS — W57XXXA Bitten or stung by nonvenomous insect and other nonvenomous arthropods, initial encounter: Secondary | ICD-10-CM

## 2016-11-10 DIAGNOSIS — M791 Myalgia, unspecified site: Secondary | ICD-10-CM | POA: Diagnosis not present

## 2016-11-10 DIAGNOSIS — R5383 Other fatigue: Secondary | ICD-10-CM | POA: Diagnosis not present

## 2016-11-10 DIAGNOSIS — M2550 Pain in unspecified joint: Secondary | ICD-10-CM

## 2016-11-10 MED ORDER — DOXYCYCLINE HYCLATE 100 MG PO TABS: 100.0000 mg | ORAL_TABLET | Freq: Two times a day (BID) | ORAL | 0 refills | Status: DC

## 2016-11-10 NOTE — Progress Notes (Signed)
HPI:  Acute visit for multiple complaints.she is concerned about tickborne illness. She was bit by a very small tick about 3-4 weeks ago. Several weeks later she started to develop polyarthralgias, myalgias, fatigue, headache and malaise. She has had these symptoms for several weeks. Instead of improving, they seem to be worsening. She denies any erythema migrans or migratory rash or any other type of rash. She denies fevers, vomiting, nausea, diarrhea, cough, chest pain, shortness of breath, weakness, numbness or altered mental status, dizziness, increased stress, changes in activities. 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  ROS: See pertinent positives and negatives per HPI.  Past Medical History:  Diagnosis Date  . ADD (attention deficit disorder with hyperactivity)   . Allergy    acute - cats  . Amenorrhea   . Asthma   . IBS (irritable bowel syndrome)   . Kidney stones   . PVC (premature ventricular contraction)     Past Surgical History:  Procedure Laterality Date  . GYNECOLOGIC CRYOSURGERY    . SPINE SURGERY    . TUBAL LIGATION      Family History  Problem Relation Age of Onset  . Heart disease Other   . Cancer Other        liver  . Colon polyps Other   . GI problems Other        IBS    Social History   Social History  . Marital status: Married    Spouse name: N/A  . Number of children: N/A  . Years of education: N/A   Social History Main Topics  . Smoking status: Former Research scientist (life sciences)  . Smokeless tobacco: Never Used  . Alcohol use No  . Drug use: No  . Sexual activity: Not Asked   Other Topics Concern  . None   Social History Narrative  . None     Current Outpatient Prescriptions:  .  cetirizine (ZYRTEC) 10 MG tablet, Take 10 mg by mouth daily., Disp: , Rfl:  .  EPINEPHrine (EPIPEN) 0.3 mg/0.3 mL SOAJ injection, Inject 0.3 mLs (0.3 mg total) into the muscle once., Disp: 2 Device, Rfl: 3 .  methylphenidate  (METADATE ER) 20 MG ER tablet, Take 1 tablet (20 mg total) by mouth daily., Disp: 30 tablet, Rfl: 0 .  methylphenidate (METADATE ER) 20 MG ER tablet, Take 1 tablet (20 mg total) by mouth daily., Disp: 30 tablet, Rfl: 0 .  methylphenidate (METADATE ER) 20 MG ER tablet, Take 1 tablet (20 mg total) by mouth daily., Disp: 30 tablet, Rfl: 0 .  doxycycline (VIBRA-TABS) 100 MG tablet, Take 1 tablet (100 mg total) by mouth 2 (two) times daily., Disp: 40 tablet, Rfl: 0  Current Facility-Administered Medications:  .  EPINEPHrine (ADRENALIN) injection 0.2 mg, 0.2 mg, Intramuscular, Once, Robyn Haber, MD  EXAM:  Vitals:   11/10/16 1545  BP: 112/78  Pulse: 81  Temp: 98.8 F (37.1 C)    Body mass index is 23.91 kg/m.  GENERAL: vitals reviewed and listed above, alert, oriented, appears well hydrated and in no acute distress  HEENT: atraumatic, conjunttiva clear, no obvious abnormalities on inspection of external nose and ears  NECK: no obvious masses on inspection  LUNGS: clear to auscultation bilaterally, no wheezes, rales or rhonchi, good air movement  CV: HRRR, no peripheral edema  MS: moves all extremities without noticeable abnormality, gait is normal, I do not appreciate any effusions of the joints, range of motion's  of large and small joint seems to be normal without any deformity, she does have some diffuse muscular tenderness to palpation in various areas of the body including the trapezius muscle region, paraspinal muscles in particular  Skin: no rashes  PSYCH: pleasant and cooperative, no obvious depression or anxiety  ASSESSMENT AND PLAN:  Discussed the following assessment and plan:  Polyarthralgia - Plan: Comprehensive metabolic panel, CBC with Differential/Platelet, Rheumatoid Factor, Cyclic citrul peptide antibody, IgG  Myalgia  Fatigue, unspecified type  Tick bite, initial encounter - Plan: B. burgdorfi Antibody  -we discussed possible serious and likely  etiologies, workup and treatment, treatment risks and return precautions; we discussed tickborne illnesses in [place], her symptoms do not seem consistent with [facility] spotted fever. Lyme's disease is becoming more prevalent in [facility] and can present in various ways. -after this discussion, Avory opted for labs per orders, also starchy with doxycycline given the increased risk of Lyme's disease in [facility] and multiple symptoms -follow up advised 2-3 weeks -of course, we advised Makiah  to return or notify a doctor immediately if symptoms worsen or persist or new concerns arise.    Patient Instructions  BEFORE YOU LEAVE: -follow up with Dr. Sherren Mocha in 2-3 weeks -labs  Take the antibiotic as instructed.  We have ordered labs or studies at this visit. It can take up to 1-2 weeks for results and processing. IF results require follow up or explanation, we will call you with instructions. Clinically stable results will be released to your [facility]. If you have not heard from [place] or cannot find your results in [facility] in 2 weeks please contact our office at [phone].  If you are not yet signed up for [facility], please consider signing up.  I hope you are feeling better soon! Seek care sooner if worsening, new concerns or you are not improving with treatment.           Colin Benton R., DO

## 2016-11-10 NOTE — Patient Instructions (Addendum)
BEFORE YOU LEAVE: -follow up with Dr. Sherren Mocha in 2-3 weeks -labs  Take the antibiotic as instructed.  We have ordered labs or studies at this visit. It can take up to 1-2 weeks for results and processing. IF results require follow up or explanation, we will call you with instructions. Clinically stable results will be released to your [facility]. If you have not heard from [place] or cannot find your results in [facility] in 2 weeks please contact our office at [phone].  If you are not yet signed up for [facility], please consider signing up.  I hope you are feeling better soon! Seek care sooner if worsening, new concerns or you are not improving with treatment.

## 2016-11-11 LAB — CBC WITH DIFFERENTIAL/PLATELET
Basophils Absolute: 0 10*3/uL (ref 0.0–0.1)
Basophils Relative: 0.6 % (ref 0.0–3.0)
EOS ABS: 0 10*3/uL (ref 0.0–0.7)
EOS PCT: 0.3 % (ref 0.0–5.0)
HCT: 38 % (ref 36.0–46.0)
HEMOGLOBIN: 12.6 g/dL (ref 12.0–15.0)
LYMPHS PCT: 36.5 % (ref 12.0–46.0)
Lymphs Abs: 2.4 10*3/uL (ref 0.7–4.0)
MCHC: 33.2 g/dL (ref 30.0–36.0)
MCV: 96 fl (ref 78.0–100.0)
MONO ABS: 0.5 10*3/uL (ref 0.1–1.0)
Monocytes Relative: 8.2 % (ref 3.0–12.0)
Neutro Abs: 3.6 10*3/uL (ref 1.4–7.7)
Neutrophils Relative %: 54.4 % (ref 43.0–77.0)
Platelets: 245 10*3/uL (ref 150.0–400.0)
RBC: 3.96 Mil/uL (ref 3.87–5.11)
RDW: 13.2 % (ref 11.5–15.5)
WBC: 6.5 10*3/uL (ref 4.0–10.5)

## 2016-11-11 LAB — COMPREHENSIVE METABOLIC PANEL
ALT: 13 U/L (ref 0–35)
AST: 14 U/L (ref 0–37)
Albumin: 4.2 g/dL (ref 3.5–5.2)
Alkaline Phosphatase: 39 U/L (ref 39–117)
BUN: 18 mg/dL (ref 6–23)
CHLORIDE: 102 meq/L (ref 96–112)
CO2: 28 mEq/L (ref 19–32)
Calcium: 9.5 mg/dL (ref 8.4–10.5)
Creatinine, Ser: 0.7 mg/dL (ref 0.40–1.20)
GFR: 97.97 mL/min (ref >60.00)
GLUCOSE: 117 mg/dL — AB (ref 70–99)
POTASSIUM: 4.3 meq/L (ref 3.5–5.1)
SODIUM: 137 meq/L (ref 135–145)
TOTAL PROTEIN: 6.9 g/dL (ref 6.0–8.3)
Total Bilirubin: 0.4 mg/dL (ref 0.2–1.2)

## 2016-11-11 LAB — B. BURGDORFI ANTIBODIES: B burgdorferi Ab IgG+IgM: <0.9 index

## 2016-11-11 LAB — RHEUMATOID FACTOR

## 2016-11-11 LAB — CYCLIC CITRUL PEPTIDE ANTIBODY, IGG

## 2016-11-15 ENCOUNTER — Ambulatory Visit: Payer: PRIVATE HEALTH INSURANCE | Admitting: Family Medicine

## 2016-11-18 ENCOUNTER — Telehealth: Payer: Self-pay | Admitting: Family Medicine

## 2016-11-18 NOTE — Telephone Encounter (Signed)
Plan recommended severe headache not responding to over-the-counter treatments that she be seen in urgent care over the weekend. Otherwise please schedule her a follow-up with her primary care doctor, Dr. Sherren Mocha, next week.

## 2016-11-18 NOTE — Telephone Encounter (Signed)
I called the pt and informed her of the message below and the pt stated she does not feel she needs to come in.  Stated he has a terrible headache and I again informed her of the recommendation per Dr Maudie Mercury and offered an appt for Monday and the pt declined.

## 2016-11-18 NOTE — Telephone Encounter (Addendum)
Pt saw Dr Maudie Mercury and states she is being treated lyme disease. Pt states she has a terrible headache.  Has tried all OTC meds (tylenol, ibuprofen) and nothing is working. Would like to know if Dr Maudie Mercury will prescribe something.  [id number] - [place], [place].

## 2016-11-21 ENCOUNTER — Telehealth: Payer: Self-pay | Admitting: Family Medicine

## 2016-11-21 NOTE — Telephone Encounter (Signed)
Pt is calling and would like to see if Dr. Sherren Mocha can help her for the headache that she has had since Thursday 1011/18 and Dr. Maudie Mercury is treating her for Lymes disease but what she was given has helped with her headache.    Pharm:  [facility]

## 2016-11-22 NOTE — Telephone Encounter (Signed)
Spoke with pt voiced understanding that dr Sherren Mocha wants her to take prednisone 20 mg, Pt was given directions on how to take the medication. Pt stated that she still has enough prednisone from previous and requested not to send any to her pharmacy. Pt was adviised to call the  Office if she does not feel better.

## 2016-11-22 NOTE — Telephone Encounter (Signed)
Please Advise

## 2016-11-22 NOTE — Telephone Encounter (Signed)
Toni Frank please call her in some prednisone 20 mg tablets #30........... 2 tabs 3 days, 1 tab 3 days, half a tab 3 days, then stop if the headache is gone........ if not completely gone and take a half a tablet Monday Wednesday Friday for 2 weeks. Also explained to her that she does not need to take 9 days worth of medication. If she takes 2 a day for 3 days and her headaches gone then this stop the prednisone right away

## 2016-12-05 ENCOUNTER — Ambulatory Visit: Payer: PRIVATE HEALTH INSURANCE | Admitting: Family Medicine

## 2017-01-09 ENCOUNTER — Telehealth: Payer: Self-pay | Admitting: Family Medicine

## 2017-01-09 NOTE — Telephone Encounter (Signed)
Copied from [place] [id number]. Topic: Quick Communication - Rx Refill/Question >> Jan 09, 2017  3:32 PM Arletha Grippe wrote: Has the patient contacted their pharmacy? No. Rx is written    (Agent: If no, request that the patient contact the pharmacy for the refill.)   Preferred Pharmacy (with phone number or street name):methylphenidate (METADATE ER) 20 MG ER tablet. Pt is requesting 3 rx's for this medicine. ( 3 mo supply) Pt will pick this up. Please call [phone]    Agent: Please be advised that RX refills may take up to 48 hours. We ask that you follow-up with your pharmacy.

## 2017-01-10 ENCOUNTER — Other Ambulatory Visit: Payer: Self-pay

## 2017-01-10 MED ORDER — METHYLPHENIDATE HCL ER 20 MG PO TBCR: 20.0000 mg | EXTENDED_RELEASE_TABLET | Freq: Every day | ORAL | 0 refills | Status: DC

## 2017-01-10 NOTE — Telephone Encounter (Signed)
Rx for METADATE ER 20 mg has been signed and is ready for pick up at the front desk, pt is aware.

## 2017-01-10 NOTE — Telephone Encounter (Signed)
Controlled substance 

## 2017-01-25 LAB — HM MAMMOGRAPHY

## 2017-02-03 ENCOUNTER — Encounter: Payer: Self-pay | Admitting: Family Medicine

## 2018-04-04 ENCOUNTER — Encounter: Payer: Self-pay | Admitting: Family Medicine

## 2018-04-05 ENCOUNTER — Ambulatory Visit: Payer: PRIVATE HEALTH INSURANCE | Admitting: Family Medicine

## 2019-01-10 ENCOUNTER — Telehealth: Payer: Self-pay | Admitting: Internal Medicine

## 2019-01-10 NOTE — Telephone Encounter (Signed)
Copied from [place] [id number]. Topic: General - Inquiry >> Jan 10, 2019  3:56 PM Mathis Bud wrote: Reason for CRM: Patient has a toc appt with Dr.Hernandez 12/23, patient would like to get a CPE done at the same time.  She does not want to pay for an appt just to "meet a provider".  Call back [phone]

## 2019-01-11 NOTE — Telephone Encounter (Signed)
Please advise. Ok for CPE during [facility] appointment?

## 2019-01-11 NOTE — Telephone Encounter (Signed)
I can only accommodate that request if she has no acute issues and has no chronic conditions that need to be addressed. The [facility] appointment is the time for me to get to know her medical history and I will not have time for a CPE if there are other issues that need to be addressed.

## 2019-01-29 ENCOUNTER — Other Ambulatory Visit: Payer: Self-pay

## 2019-01-30 ENCOUNTER — Ambulatory Visit (INDEPENDENT_AMBULATORY_CARE_PROVIDER_SITE_OTHER): Payer: PRIVATE HEALTH INSURANCE | Admitting: Internal Medicine

## 2019-01-30 ENCOUNTER — Encounter: Payer: Self-pay | Admitting: Internal Medicine

## 2019-01-30 VITALS — BP 110/80 | HR 72 | Temp 97.9°F | Ht 64.0 in | Wt 147.7 lb

## 2019-01-30 DIAGNOSIS — Z23 Encounter for immunization: Secondary | ICD-10-CM

## 2019-01-30 DIAGNOSIS — Z0000 Encounter for general adult medical examination without abnormal findings: Secondary | ICD-10-CM

## 2019-01-30 DIAGNOSIS — E039 Hypothyroidism, unspecified: Secondary | ICD-10-CM

## 2019-01-30 LAB — TSH: TSH: 1.14 u[IU]/mL (ref 0.35–4.50)

## 2019-01-30 NOTE — Addendum Note (Signed)
Addended by: Raliegh Ip on: 01/30/2019 11:43 AM   Modules accepted: Orders

## 2019-01-30 NOTE — Patient Instructions (Signed)
-Nice seeing you today!!  -Lab work today; will notify you once results are available.  -Flu vaccine today.  -Schedule follow up in 6 months.   Preventive Care 71-43 Years Old, Female Preventive care refers to visits with your health care provider and lifestyle choices that can promote health and wellness. This includes:  A yearly physical exam. This may also be called an annual well check.  Regular dental visits and eye exams.  Immunizations.  Screening for certain conditions.  Healthy lifestyle choices, such as eating a healthy diet, getting regular exercise, not using drugs or products that contain nicotine and tobacco, and limiting alcohol use. What can I expect for my preventive care visit? Physical exam Your health care provider will check your:  Height and weight. This may be used to calculate body mass index (BMI), which tells if you are at a healthy weight.  Heart rate and blood pressure.  Skin for abnormal spots. Counseling Your health care provider may ask you questions about your:  Alcohol, tobacco, and drug use.  Emotional well-being.  Home and relationship well-being.  Sexual activity.  Eating habits.  Work and work Statistician.  Method of birth control.  Menstrual cycle.  Pregnancy history. What immunizations do I need?  Influenza (flu) vaccine  This is recommended every year. Tetanus, diphtheria, and pertussis (Tdap) vaccine  You may need a Td booster every 10 years. Varicella (chickenpox) vaccine  You may need this if you have not been vaccinated. Zoster (shingles) vaccine  You may need this after age 80. Measles, mumps, and rubella (MMR) vaccine  You may need at least one dose of MMR if you were born in 1957 or later. You may also need a second dose. Pneumococcal conjugate (PCV13) vaccine  You may need this if you have certain conditions and were not previously vaccinated. Pneumococcal polysaccharide (PPSV23) vaccine  You may  need one or two doses if you smoke cigarettes or if you have certain conditions. Meningococcal conjugate (MenACWY) vaccine  You may need this if you have certain conditions. Hepatitis A vaccine  You may need this if you have certain conditions or if you travel or work in places where you may be exposed to hepatitis A. Hepatitis B vaccine  You may need this if you have certain conditions or if you travel or work in places where you may be exposed to hepatitis B. Haemophilus influenzae type b (Hib) vaccine  You may need this if you have certain conditions. Human papillomavirus (HPV) vaccine  If recommended by your health care provider, you may need three doses over 6 months. You may receive vaccines as individual doses or as more than one vaccine together in one shot (combination vaccines). Talk with your health care provider about the risks and benefits of combination vaccines. What tests do I need? Blood tests  Lipid and cholesterol levels. These may be checked every 5 years, or more frequently if you are over 33 years old.  Hepatitis C test.  Hepatitis B test. Screening  Lung cancer screening. You may have this screening every year starting at age 37 if you have a 30-pack-year history of smoking and currently smoke or have quit within the past 15 years.  Colorectal cancer screening. All adults should have this screening starting at age 8 and continuing until age 29. Your health care provider may recommend screening at age 57 if you are at increased risk. You will have tests every 1-10 years, depending on your results and the type  of screening test.  Diabetes screening. This is done by checking your blood sugar (glucose) after you have not eaten for a while (fasting). You may have this done every 1-3 years.  Mammogram. This may be done every 1-2 years. Talk with your health care provider about when you should start having regular mammograms. This may depend on whether you have a  family history of breast cancer.  BRCA-related cancer screening. This may be done if you have a family history of breast, ovarian, tubal, or peritoneal cancers.  Pelvic exam and Pap test. This may be done every 3 years starting at age 22. Starting at age 36, this may be done every 5 years if you have a Pap test in combination with an HPV test. Other tests  Sexually transmitted disease (STD) testing.  Bone density scan. This is done to screen for osteoporosis. You may have this scan if you are at high risk for osteoporosis. Follow these instructions at home: Eating and drinking  Eat a diet that includes fresh fruits and vegetables, whole grains, lean protein, and low-fat dairy.  Take vitamin and mineral supplements as recommended by your health care provider.  Do not drink alcohol if: ? Your health care provider tells you not to drink. ? You are pregnant, may be pregnant, or are planning to become pregnant.  If you drink alcohol: ? Limit how much you have to 0-1 drink a day. ? Be aware of how much alcohol is in your drink. In the [place] one drink equals one 12 oz bottle of beer (355 mL), one 5 oz glass of wine (148 mL), or one 1 oz glass of hard liquor (44 mL). Lifestyle  Take daily care of your teeth and gums.  Stay active. Exercise for at least 30 minutes on 5 or more days each week.  Do not use any products that contain nicotine or tobacco, such as cigarettes, e-cigarettes, and chewing tobacco. If you need help quitting, ask your health care provider.  If you are sexually active, practice safe sex. Use a condom or other form of birth control (contraception) in order to prevent pregnancy and STIs (sexually transmitted infections).  If told by your health care provider, take low-dose aspirin daily starting at age 42. What's next?  Visit your health care provider once a year for a well check visit.  Ask your health care provider how often you should have your eyes and teeth  checked.  Stay up to date on all vaccines. This information is not intended to replace advice given to you by your health care provider. Make sure you discuss any questions you have with your health care provider. Document Released: 02/20/2015 Document Revised: 10/05/2017 Document Reviewed: 10/05/2017 Elsevier Patient Education  2020 [employer].

## 2019-01-30 NOTE — Progress Notes (Signed)
Established Patient Office Visit     This visit occurred during the SARS-CoV-2 public health emergency.  Safety protocols were in place, including screening questions prior to the visit, additional usage of staff PPE, and extensive cleaning of exam room while observing appropriate contact time as indicated for disinfecting solutions.    CC/Reason for Visit: Establish care, annual preventive exam  HPI: Toni Frank is a 43 y.o. female who is coming in today for the above mentioned reasons. Past Medical History is significant for: Hypothyroidism on 50 mcg of levothyroxine, she has a past history of Lyme's disease.  She has no acute complaints today.  She teaches high school math.  She has 3 boys ages 75, 66 and 52.  She has allergies to sulfa drugs which cause hives.  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 Sherwood Gambler, she used to be a smoker in college quit many years ago, does not drink alcohol, she has no family history of significance.  She has a GYN.  She had a mammogram in January of this year, she is requesting flu vaccine today.   Past Medical/Surgical History: Past Medical History:  Diagnosis Date  . ADD (attention deficit disorder with hyperactivity)   . Allergy    acute - cats  . Asthma   . IBS (irritable bowel syndrome)   . Irregular periods   . Migraines   . PVC (premature ventricular contraction)     Past Surgical History:  Procedure Laterality Date  . GYNECOLOGIC CRYOSURGERY    . SPINE SURGERY    . TUBAL LIGATION      Social History:  reports that she has quit smoking. She has never used smokeless tobacco. She reports that she does not drink alcohol or use drugs.  Allergies: Allergies  Allergen Reactions  . Cortisone Other (See Comments)    unknown  . Sulfonamide Derivatives     REACTION: hives    Family History:  Family History  Problem Relation Age of Onset  . Heart disease Other   . Cancer Other        liver  . Colon polyps Other   . GI  problems Other        IBS  . Cervical cancer Maternal Grandmother      Current Outpatient Medications:  .  cetirizine (ZYRTEC) 10 MG tablet, Take 10 mg by mouth daily., Disp: , Rfl:  .  EPINEPHrine (EPIPEN) 0.3 mg/0.3 mL SOAJ injection, Inject 0.3 mLs (0.3 mg total) into the muscle once., Disp: 2 Device, Rfl: 3 .  levothyroxine (SYNTHROID) 50 MCG tablet, Take 50 mcg by mouth daily., Disp: , Rfl:   Current Facility-Administered Medications:  .  EPINEPHrine (ADRENALIN) injection 0.2 mg, 0.2 mg, Intramuscular, Once, Robyn Haber, MD  Review of Systems:  Constitutional: Denies fever, chills, diaphoresis, appetite change and fatigue.  HEENT: Denies photophobia, eye pain, redness, hearing loss, ear pain, congestion, sore throat, rhinorrhea, sneezing, mouth sores, trouble swallowing, neck pain, neck stiffness and tinnitus.   Respiratory: Denies SOB, DOE, cough, chest tightness,  and wheezing.   Cardiovascular: Denies chest pain, palpitations and leg swelling.  Gastrointestinal: Denies nausea, vomiting, abdominal pain, diarrhea, constipation, blood in stool and abdominal distention.  Genitourinary: Denies dysuria, urgency, frequency, hematuria, flank pain and difficulty urinating.  Endocrine: Denies: hot or cold intolerance, sweats, changes in hair or nails, polyuria, polydipsia. Musculoskeletal: Denies myalgias, back pain, joint swelling, arthralgias and gait problem.  Skin: Denies pallor, rash and wound.  Neurological:  Denies dizziness, seizures, syncope, weakness, light-headedness, numbness and headaches.  Hematological: Denies adenopathy. Easy bruising, personal or family bleeding history  Psychiatric/Behavioral: Denies suicidal ideation, mood changes, confusion, nervousness, sleep disturbance and agitation    Physical Exam: Vitals:   01/30/19 1057  BP: 110/80  Pulse: 72  Temp: 97.9 F (36.6 C)  TempSrc: Temporal  SpO2: 98%  Weight: 147 lb 11.2 oz (67 kg)  Height: 5' 4"  (1.626  m)    Body mass index is 25.35 kg/m.   Constitutional: NAD, calm, comfortable Eyes: PERRL, lids and conjunctivae normal ENMT: Mucous membranes are moist.Tympanic membrane is pearly white, no erythema or bulging. Neck: normal, supple, no masses, no thyromegaly Respiratory: clear to auscultation bilaterally, no wheezing, no crackles. Normal respiratory effort. No accessory muscle use.  Cardiovascular: Regular rate and rhythm, no murmurs / rubs / gallops. No extremity edema. 2+ pedal pulses.   Abdomen: no tenderness, no masses palpated. No hepatosplenomegaly. Bowel sounds positive.  Musculoskeletal: no clubbing / cyanosis. No joint deformity upper and lower extremities. Good ROM, no contractures. Normal muscle tone.  Skin: no rashes, lesions, ulcers. No induration Neurologic: CN 2-12 grossly intact. Sensation intact, DTR normal. Strength 5/5 in all 4.  Psychiatric: Normal judgment and insight. Alert and oriented x 3. Normal mood.    Impression and Plan:  Encounter for preventive health examination -Advised routine eye and dental care. -Flu vaccine today, otherwise immunizations are up-to-date. -She has updated lab work from earlier this year, none today. -Healthy lifestyle has been discussed in detail. -Commence routine colon cancer screening age 89. -She had a mammogram in January 2020 that was normal. -She follows routinely with her OB/GYN who is in charge of cervical cancer screening.  Hypothyroidism, unspecified type  - Plan: TSH -Last TSH was 1.060 in April 2020. -Continue current Synthroid dose.    Patient Instructions  -Nice seeing you today!!  -Lab work today; will notify you once results are available.  -Flu vaccine today.  -Schedule follow up in 6 months.   Preventive Care 3-91 Years Old, Female Preventive care refers to visits with your health care provider and lifestyle choices that can promote health and wellness. This includes:  A yearly physical exam.  This may also be called an annual well check.  Regular dental visits and eye exams.  Immunizations.  Screening for certain conditions.  Healthy lifestyle choices, such as eating a healthy diet, getting regular exercise, not using drugs or products that contain nicotine and tobacco, and limiting alcohol use. What can I expect for my preventive care visit? Physical exam Your health care provider will check your:  Height and weight. This may be used to calculate body mass index (BMI), which tells if you are at a healthy weight.  Heart rate and blood pressure.  Skin for abnormal spots. Counseling Your health care provider may ask you questions about your:  Alcohol, tobacco, and drug use.  Emotional well-being.  Home and relationship well-being.  Sexual activity.  Eating habits.  Work and work Statistician.  Method of birth control.  Menstrual cycle.  Pregnancy history. What immunizations do I need?  Influenza (flu) vaccine  This is recommended every year. Tetanus, diphtheria, and pertussis (Tdap) vaccine  You may need a Td booster every 10 years. Varicella (chickenpox) vaccine  You may need this if you have not been vaccinated. Zoster (shingles) vaccine  You may need this after age 80. Measles, mumps, and rubella (MMR) vaccine  You may need at least one dose of MMR  if you were born in 1957 or later. You may also need a second dose. Pneumococcal conjugate (PCV13) vaccine  You may need this if you have certain conditions and were not previously vaccinated. Pneumococcal polysaccharide (PPSV23) vaccine  You may need one or two doses if you smoke cigarettes or if you have certain conditions. Meningococcal conjugate (MenACWY) vaccine  You may need this if you have certain conditions. Hepatitis A vaccine  You may need this if you have certain conditions or if you travel or work in places where you may be exposed to hepatitis A. Hepatitis B vaccine  You may  need this if you have certain conditions or if you travel or work in places where you may be exposed to hepatitis B. Haemophilus influenzae type b (Hib) vaccine  You may need this if you have certain conditions. Human papillomavirus (HPV) vaccine  If recommended by your health care provider, you may need three doses over 6 months. You may receive vaccines as individual doses or as more than one vaccine together in one shot (combination vaccines). Talk with your health care provider about the risks and benefits of combination vaccines. What tests do I need? Blood tests  Lipid and cholesterol levels. These may be checked every 5 years, or more frequently if you are over 73 years old.  Hepatitis C test.  Hepatitis B test. Screening  Lung cancer screening. You may have this screening every year starting at age 70 if you have a 30-pack-year history of smoking and currently smoke or have quit within the past 15 years.  Colorectal cancer screening. All adults should have this screening starting at age 22 and continuing until age 51. Your health care provider may recommend screening at age 58 if you are at increased risk. You will have tests every 1-10 years, depending on your results and the type of screening test.  Diabetes screening. This is done by checking your blood sugar (glucose) after you have not eaten for a while (fasting). You may have this done every 1-3 years.  Mammogram. This may be done every 1-2 years. Talk with your health care provider about when you should start having regular mammograms. This may depend on whether you have a family history of breast cancer.  BRCA-related cancer screening. This may be done if you have a family history of breast, ovarian, tubal, or peritoneal cancers.  Pelvic exam and Pap test. This may be done every 3 years starting at age 19. Starting at age 16, this may be done every 5 years if you have a Pap test in combination with an HPV test. Other  tests  Sexually transmitted disease (STD) testing.  Bone density scan. This is done to screen for osteoporosis. You may have this scan if you are at high risk for osteoporosis. Follow these instructions at home: Eating and drinking  Eat a diet that includes fresh fruits and vegetables, whole grains, lean protein, and low-fat dairy.  Take vitamin and mineral supplements as recommended by your health care provider.  Do not drink alcohol if: ? Your health care provider tells you not to drink. ? You are pregnant, may be pregnant, or are planning to become pregnant.  If you drink alcohol: ? Limit how much you have to 0-1 drink a day. ? Be aware of how much alcohol is in your drink. In the [place] one drink equals one 12 oz bottle of beer (355 mL), one 5 oz glass of wine (148 mL), or one 1  oz glass of hard liquor (44 mL). Lifestyle  Take daily care of your teeth and gums.  Stay active. Exercise for at least 30 minutes on 5 or more days each week.  Do not use any products that contain nicotine or tobacco, such as cigarettes, e-cigarettes, and chewing tobacco. If you need help quitting, ask your health care provider.  If you are sexually active, practice safe sex. Use a condom or other form of birth control (contraception) in order to prevent pregnancy and STIs (sexually transmitted infections).  If told by your health care provider, take low-dose aspirin daily starting at age 71. What's next?  Visit your health care provider once a year for a well check visit.  Ask your health care provider how often you should have your eyes and teeth checked.  Stay up to date on all vaccines. This information is not intended to replace advice given to you by your health care provider. Make sure you discuss any questions you have with your health care provider. Document Released: 02/20/2015 Document Revised: 10/05/2017 Document Reviewed: 10/05/2017 Elsevier Patient Education  2020 [place], [place] [facility]

## 2019-02-20 ENCOUNTER — Encounter: Payer: Self-pay | Admitting: Internal Medicine

## 2019-04-22 LAB — HM MAMMOGRAPHY

## 2019-05-02 ENCOUNTER — Encounter: Payer: Self-pay | Admitting: Internal Medicine

## 2020-01-26 ENCOUNTER — Encounter: Payer: Self-pay | Admitting: Internal Medicine

## 2020-01-27 ENCOUNTER — Ambulatory Visit (INDEPENDENT_AMBULATORY_CARE_PROVIDER_SITE_OTHER): Payer: PRIVATE HEALTH INSURANCE

## 2020-01-27 ENCOUNTER — Ambulatory Visit
Admission: RE | Admit: 2020-01-27 | Discharge: 2020-01-27 | Disposition: A | Discharge status: Home / Self Care | Payer: PRIVATE HEALTH INSURANCE | Source: Ambulatory Visit

## 2020-01-27 VITALS — BP 130/89 | HR 71 | Temp 98.1°F | Resp 18

## 2020-01-27 DIAGNOSIS — R059 Cough, unspecified: Secondary | ICD-10-CM

## 2020-01-27 DIAGNOSIS — R52 Pain, unspecified: Secondary | ICD-10-CM | POA: Diagnosis not present

## 2020-01-27 DIAGNOSIS — R509 Fever, unspecified: Secondary | ICD-10-CM

## 2020-01-27 DIAGNOSIS — J22 Unspecified acute lower respiratory infection: Secondary | ICD-10-CM | POA: Diagnosis not present

## 2020-01-27 DIAGNOSIS — Z8616 Personal history of COVID-19: Secondary | ICD-10-CM | POA: Diagnosis not present

## 2020-01-27 LAB — POCT INFLUENZA A/B
Influenza A, POC: NEGATIVE
Influenza B, POC: NEGATIVE

## 2020-01-27 MED ORDER — BENZONATATE 200 MG PO CAPS: 200.0000 mg | ORAL_CAPSULE | Freq: Three times a day (TID) | ORAL | 0 refills | Status: AC | PRN

## 2020-01-27 MED ORDER — DM-GUAIFENESIN ER 30-600 MG PO TB12: 1.0000 | ORAL_TABLET | Freq: Two times a day (BID) | ORAL | 0 refills | Status: DC

## 2020-01-27 MED ORDER — AZITHROMYCIN 250 MG PO TABS: 250.0000 mg | ORAL_TABLET | Freq: Every day | ORAL | 0 refills | Status: DC

## 2020-01-27 NOTE — Discharge Instructions (Addendum)
Flu test negative Chest x-ray normal without any signs of pneumonia Begin azithromycin-2 tablets today, 1 tablet for the following 4 days Mucinex DM to further help with congestion and cough Tessalon for cough every 8 hours Honey and hot tea with lemon and ginger Rest and fluids Tylenol and ibuprofen to help with any chest discomfort  Follow-up if not improving or worsening

## 2020-01-27 NOTE — ED Triage Notes (Signed)
Pt c/o non-productive cough for approx 1 week, fever of 100.7, body aches, HA onset Saturday, low-grade fever since then. Pt also reports that she feels she has to cough if takes a deep breath  Denies sore throat, SOB, congestion, n/v/d.   Last dose of tylenol 1000mg  this morning at 0800.  Pt states she had COVID 10/25 with only mild fatigue, loss of taste/smell symptoms. Pt had video visit yesterday for symptoms and rx for tamiflu was called in, pt hasn't started taking it as yet.

## 2020-01-27 NOTE — ED Provider Notes (Signed)
[facility]    CSN: [id number] Arrival date & time: 01/27/20  2505      History   Chief Complaint Chief Complaint  Patient presents with  . Cough    HPI Toni Frank is a 44 y.o. female presenting today for evaluation of a cough.  Reports she has had a cough for approximately 1 week.  Notes fevers of 100.7 on Saturday.  Has had associated body aches and headache.  Low-grade fever since.  Denies any sore throat, shortness of breath nausea vomiting or diarrhea.  Recently had Covid November 2021.  Reports associated chest pressure.  HPI  Past Medical History:  Diagnosis Date  . ADD (attention deficit disorder with hyperactivity)   . Allergy    acute - cats  . Asthma   . IBS (irritable bowel syndrome)   . Irregular periods   . Migraines   . PVC (premature ventricular contraction)     Patient Active Problem List   Diagnosis Date Noted  . Hypothyroidism 01/30/2019  . Palpitations 01/29/2015  . Fibrocystic breast disease in female 07/13/2012  . ADHD (attention deficit hyperactivity disorder) 05/03/2011  . Dysplastic nevus of trunk 03/22/2011  . [facility] DISEASE 04/16/2009    Past Surgical History:  Procedure Laterality Date  . GYNECOLOGIC CRYOSURGERY    . SPINE SURGERY    . TUBAL LIGATION      OB History   No obstetric history on file.      Home Medications    Prior to Admission medications   Medication Sig Start Date End Date Taking? Authorizing Provider  cetirizine (ZYRTEC) 10 MG tablet Take 10 mg by mouth daily.   Yes [provider]  azithromycin (ZITHROMAX) 250 MG tablet Take 1 tablet (250 mg total) by mouth daily. Take first 2 tablets together, then 1 every day until finished. 01/27/20   Bobetta Korf C, PA-C  benzonatate (TESSALON) 200 MG capsule Take 1 capsule (200 mg total) by mouth 3 (three) times daily as needed for up to 7 days for cough. 01/27/20 02/03/20  Mertie Haslem C, PA-C  dextromethorphan-guaiFENesin  (MUCINEX DM) 30-600 MG 12hr tablet Take 1 tablet by mouth 2 (two) times daily. 01/27/20   Lonnetta Kniskern C, PA-C  EPINEPHrine (EPIPEN) 0.3 mg/0.3 mL SOAJ injection Inject 0.3 mLs (0.3 mg total) into the muscle once. 12/24/12   Dorena Cookey, MD  oseltamivir (TAMIFLU) 75 MG capsule Take 75 mg by mouth 2 (two) times daily. 01/27/20   [provider]  levothyroxine (SYNTHROID) 50 MCG tablet Take 50 mcg by mouth daily.  01/27/20  [provider]    Family History Family History  Problem Relation Age of Onset  . Heart disease Other   . Cancer Other        liver  . Colon polyps Other   . GI problems Other        IBS  . Cervical cancer Maternal Grandmother     Social History Social History   Tobacco Use  . Smoking status: Former Research scientist (life sciences)  . Smokeless tobacco: Never Used  Vaping Use  . Vaping Use: Never used  Substance Use Topics  . Alcohol use: No  . Drug use: No     Allergies   Cortisone and Sulfonamide derivatives   Review of Systems Review of Systems  Constitutional: Positive for fatigue and fever. Negative for activity change, appetite change and chills.  HENT: Negative for congestion, ear pain, rhinorrhea, sinus pressure, sore throat and trouble swallowing.  Eyes: Negative for discharge and redness.  Respiratory: Positive for cough and chest tightness. Negative for shortness of breath.   Cardiovascular: Negative for chest pain.  Gastrointestinal: Negative for abdominal pain, diarrhea, nausea and vomiting.  Musculoskeletal: Negative for myalgias.  Skin: Negative for rash.  Neurological: Negative for dizziness, light-headedness and headaches.     Physical Exam Triage Vital Signs ED Triage Vitals  Enc Vitals Group     BP      Pulse      Resp      Temp      Temp src      SpO2      Weight      Height      Head Circumference      Peak Flow      Pain Score      Pain Loc      Pain Edu?      Excl. in [place]?    No data found.  Updated Vital  Signs BP 130/89 (BP Location: Left Arm)   Pulse 71   Temp 98.1 F (36.7 C) (Oral)   Resp 18   LMP 01/08/2020 (Approximate)   SpO2 99%   Visual Acuity Right Eye Distance:   Left Eye Distance:   Bilateral Distance:    Right Eye Near:   Left Eye Near:    Bilateral Near:     Physical Exam Vitals and nursing note reviewed.  Constitutional:      Appearance: She is well-developed and well-nourished.     Comments: No acute distress  HENT:     Head: Normocephalic and atraumatic.     Ears:     Comments: Bilateral ears without tenderness to palpation of external auricle, tragus and mastoid, EAC's without erythema or swelling, TM's with good bony landmarks and cone of light. Non erythematous.     Nose: Nose normal.     Mouth/Throat:     Comments: Oral mucosa pink and moist, no tonsillar enlargement or exudate. Posterior pharynx patent and nonerythematous, no uvula deviation or swelling. Normal phonation. Eyes:     Conjunctiva/sclera: Conjunctivae normal.  Cardiovascular:     Rate and Rhythm: Normal rate.  Pulmonary:     Effort: Pulmonary effort is normal. No respiratory distress.     Comments: Breathing comfortably at rest, CTABL, no wheezing, rales or other adventitious sounds auscultated Abdominal:     General: There is no distension.  Musculoskeletal:        General: Normal range of motion.     Cervical back: Neck supple.  Skin:    General: Skin is warm and dry.  Neurological:     Mental Status: She is alert and oriented to person, place, and time.  Psychiatric:        Mood and Affect: Mood and affect normal.      UC Treatments / Results  Labs (all labs ordered are listed, but only abnormal results are displayed) Labs Reviewed  POCT INFLUENZA A/B    EKG   Radiology DG Chest 2 View  Result Date: 01/27/2020 CLINICAL DATA:  Cough for 1 week with chest pressure, fever, and body aches. History of COVID-19 infection 1 month ago. EXAM: CHEST - 2 VIEW COMPARISON:   None. FINDINGS: The cardiomediastinal silhouette is within normal limits. The lungs are well inflated. No confluent airspace opacity, edema, pleural effusion, pneumothorax is identified. No acute osseous abnormality is seen. IMPRESSION: No active cardiopulmonary disease. Electronically Signed   By: Seymour Bars.D.  On: 01/27/2020 10:52    Procedures Procedures (including critical care time)  Medications Ordered in UC Medications - No data to display  Initial Impression / Assessment and Plan / UC Course  I have reviewed the triage vital signs and the nursing notes.  Pertinent labs & imaging results that were available during my care of the patient were reviewed by me and considered in my medical decision making (see chart for details).     Cough-rapid flu negative, chest x-ray normal.  Recent Covid infection.  Recommending continued symptomatic and supportive care of cough and congestion.  Given symptoms x1 week will still cover for atypicals with azithromycin.  Rest and fluids.  Discussed strict return precautions. Patient verbalized understanding and is agreeable with plan.  Final Clinical Impressions(s) / UC Diagnoses   Final diagnoses:  Cough  Lower respiratory infection (e.g., bronchitis, pneumonia, pneumonitis, pulmonitis)     Discharge Instructions     Flu test negative Chest x-ray normal without any signs of pneumonia Begin azithromycin-2 tablets today, 1 tablet for the following 4 days Mucinex DM to further help with congestion and cough Tessalon for cough every 8 hours Honey and hot tea with lemon and ginger Rest and fluids Tylenol and ibuprofen to help with any chest discomfort  Follow-up if not improving or worsening    ED Prescriptions    Medication Sig Dispense Auth. Provider   benzonatate (TESSALON) 200 MG capsule Take 1 capsule (200 mg total) by mouth 3 (three) times daily as needed for up to 7 days for cough. 28 capsule Marysol Wellnitz C, PA-C    azithromycin (ZITHROMAX) 250 MG tablet Take 1 tablet (250 mg total) by mouth daily. Take first 2 tablets together, then 1 every day until finished. 6 tablet Emmarae Cowdery C, PA-C   dextromethorphan-guaiFENesin (MUCINEX DM) 30-600 MG 12hr tablet Take 1 tablet by mouth 2 (two) times daily. 20 tablet Smriti Barkow, [place] C, PA-C     PDMP not reviewed this encounter.   Kevaughn Ewing, [place] C, PA-C 01/27/20 1059

## 2021-04-13 ENCOUNTER — Ambulatory Visit: Payer: Self-pay | Admitting: Internal Medicine

## 2021-07-16 ENCOUNTER — Encounter: Payer: Self-pay | Admitting: Gastroenterology

## 2021-08-02 ENCOUNTER — Encounter: Payer: Self-pay | Admitting: Gastroenterology

## 2021-08-03 ENCOUNTER — Encounter: Payer: Self-pay | Admitting: Gastroenterology

## 2021-08-13 ENCOUNTER — Other Ambulatory Visit: Payer: Self-pay | Admitting: Obstetrics & Gynecology

## 2021-08-13 ENCOUNTER — Other Ambulatory Visit (HOSPITAL_COMMUNITY): Payer: Self-pay | Admitting: Obstetrics & Gynecology

## 2021-08-13 DIAGNOSIS — E041 Nontoxic single thyroid nodule: Secondary | ICD-10-CM

## 2021-08-24 ENCOUNTER — Emergency Department (HOSPITAL_BASED_OUTPATIENT_CLINIC_OR_DEPARTMENT_OTHER)
Admission: EM | Admit: 2021-08-24 | Discharge: 2021-08-25 | Disposition: A | Discharge status: Home / Self Care | Payer: No Typology Code available for payment source | Attending: Emergency Medicine | Admitting: Emergency Medicine

## 2021-08-24 ENCOUNTER — Emergency Department (HOSPITAL_BASED_OUTPATIENT_CLINIC_OR_DEPARTMENT_OTHER): Payer: No Typology Code available for payment source

## 2021-08-24 ENCOUNTER — Encounter (HOSPITAL_BASED_OUTPATIENT_CLINIC_OR_DEPARTMENT_OTHER): Payer: Self-pay | Admitting: Pediatrics

## 2021-08-24 ENCOUNTER — Other Ambulatory Visit: Payer: Self-pay

## 2021-08-24 DIAGNOSIS — R197 Diarrhea, unspecified: Secondary | ICD-10-CM | POA: Diagnosis not present

## 2021-08-24 DIAGNOSIS — R112 Nausea with vomiting, unspecified: Secondary | ICD-10-CM | POA: Insufficient documentation

## 2021-08-24 DIAGNOSIS — R1032 Left lower quadrant pain: Secondary | ICD-10-CM | POA: Insufficient documentation

## 2021-08-24 DIAGNOSIS — Z8719 Personal history of other diseases of the digestive system: Secondary | ICD-10-CM | POA: Insufficient documentation

## 2021-08-24 DIAGNOSIS — R109 Unspecified abdominal pain: Secondary | ICD-10-CM | POA: Diagnosis present

## 2021-08-24 LAB — URINALYSIS, ROUTINE W REFLEX MICROSCOPIC
Bilirubin Urine: NEGATIVE
Glucose, UA: NEGATIVE mg/dL
Hgb urine dipstick: NEGATIVE
Ketones, ur: NEGATIVE mg/dL
Nitrite: NEGATIVE
Protein, ur: NEGATIVE mg/dL
Specific Gravity, Urine: 1.02 (ref 1.005–1.030)
pH: 5.5 (ref 5.0–8.0)

## 2021-08-24 LAB — COMPREHENSIVE METABOLIC PANEL
ALT: 10 U/L (ref 0–44)
AST: 14 U/L — ABNORMAL LOW (ref 15–41)
Albumin: 4.4 g/dL (ref 3.5–5.0)
Alkaline Phosphatase: 42 U/L (ref 38–126)
Anion gap: 6 (ref 5–15)
BUN: 15 mg/dL (ref 6–20)
CO2: 30 mmol/L (ref 22–32)
Calcium: 9.3 mg/dL (ref 8.9–10.3)
Chloride: 101 mmol/L (ref 98–111)
Creatinine, Ser: 0.83 mg/dL (ref 0.44–1.00)
GFR, Estimated: >60 mL/min (ref >60)
Glucose, Bld: 111 mg/dL — ABNORMAL HIGH (ref 70–99)
Potassium: 3.5 mmol/L (ref 3.5–5.1)
Sodium: 137 mmol/L (ref 135–145)
Total Bilirubin: 0.5 mg/dL (ref 0.3–1.2)
Total Protein: 7.8 g/dL (ref 6.5–8.1)

## 2021-08-24 LAB — CBC
HCT: 39.5 % (ref 36.0–46.0)
Hemoglobin: 13.2 g/dL (ref 12.0–15.0)
MCH: 31.8 pg (ref 26.0–34.0)
MCHC: 33.4 g/dL (ref 30.0–36.0)
MCV: 95.2 fL (ref 80.0–100.0)
Platelets: 251 10*3/uL (ref 150–400)
RBC: 4.15 MIL/uL (ref 3.87–5.11)
RDW: 12.3 % (ref 11.5–15.5)
WBC: 8.5 10*3/uL (ref 4.0–10.5)
nRBC: 0 % (ref 0.0–0.2)

## 2021-08-24 LAB — PREGNANCY, URINE: Preg Test, Ur: NEGATIVE

## 2021-08-24 LAB — LIPASE, BLOOD: Lipase: 34 U/L (ref 11–51)

## 2021-08-24 LAB — URINALYSIS, MICROSCOPIC (REFLEX): RBC / HPF: NONE SEEN RBC/hpf (ref 0–5)

## 2021-08-24 MED ORDER — ONDANSETRON HCL 4 MG PO TABS: 4.0000 mg | ORAL_TABLET | Freq: Four times a day (QID) | ORAL | 0 refills | Status: DC

## 2021-08-24 MED ORDER — KETOROLAC TROMETHAMINE 15 MG/ML IJ SOLN
15.0000 mg | Freq: Once | INTRAMUSCULAR | Status: AC
  Administered 2021-08-24: 15 mg via INTRAVENOUS
  Filled 2021-08-24: qty 1

## 2021-08-24 MED ORDER — ONDANSETRON HCL 4 MG/2ML IJ SOLN
4.0000 mg | Freq: Once | INTRAMUSCULAR | Status: AC
  Administered 2021-08-24: 4 mg via INTRAVENOUS
  Filled 2021-08-24: qty 2

## 2021-08-24 MED ORDER — SODIUM CHLORIDE 0.9 % IV BOLUS
1000.0000 mL | Freq: Once | INTRAVENOUS | Status: AC
  Administered 2021-08-24: 1000 mL via INTRAVENOUS

## 2021-08-24 MED ORDER — IOHEXOL 300 MG/ML  SOLN
100.0000 mL | Freq: Once | INTRAMUSCULAR | Status: AC | PRN
  Administered 2021-08-24: 100 mL via INTRAVENOUS

## 2021-08-24 NOTE — ED Notes (Signed)
Patient transported to CT 

## 2021-08-24 NOTE — ED Provider Notes (Signed)
[facility]MENT Provider Note   CSN: [id number] Arrival date & time: 08/24/21  2005     History  Chief Complaint  Patient presents with   Abdominal Pain   Dehydration    MAAT KAFER is a 46 y.o. female.  Patient is a 46 yo female with PMH of IBS presenting for abdominal pain. Pt admits to hx of nausea without vomiting, decreased appetite, and diarrhea several times a day. Admits to generalized abdominal pain. Symptoms started on July 4th, are more severe in the morning, and improve gradually throughout the day. Admits to salty/metallic taste in mouth after eating. Hx of travel to [place] July 10th butsymptoms started prior to trip.   The history is provided by the patient. No language interpreter was used.  Abdominal Pain Associated symptoms: diarrhea and nausea   Associated symptoms: no chest pain, no chills, no cough, no dysuria, no fever, no hematuria, no shortness of breath, no sore throat and no vomiting        Home Medications Prior to Admission medications   Medication Sig Start Date End Date Taking? Authorizing Provider  ondansetron (ZOFRAN) 4 MG tablet Take 1 tablet (4 mg total) by mouth every 6 (six) hours. 09/18/73  Yes Campbell Stall P, DO  azithromycin (ZITHROMAX) 250 MG tablet Take 1 tablet (250 mg total) by mouth daily. Take first 2 tablets together, then 1 every day until finished. 01/27/20   Wieters, Hallie C, PA-C  cetirizine (ZYRTEC) 10 MG tablet Take 10 mg by mouth daily.    [provider]  dextromethorphan-guaiFENesin (MUCINEX DM) 30-600 MG 12hr tablet Take 1 tablet by mouth 2 (two) times daily. 01/27/20   Wieters, Hallie C, PA-C  EPINEPHrine (EPIPEN) 0.3 mg/0.3 mL SOAJ injection Inject 0.3 mLs (0.3 mg total) into the muscle once. 12/24/12   Dorena Cookey, MD  oseltamivir (TAMIFLU) 75 MG capsule Take 75 mg by mouth 2 (two) times daily. 01/27/20   [provider]  levothyroxine (SYNTHROID) 50 MCG tablet Take  50 mcg by mouth daily.  01/27/20  [provider]      Allergies    Cortisone and Sulfonamide derivatives    Review of Systems   Review of Systems  Constitutional:  Negative for chills and fever.  HENT:  Negative for ear pain and sore throat.   Eyes:  Negative for pain and visual disturbance.  Respiratory:  Negative for cough and shortness of breath.   Cardiovascular:  Negative for chest pain and palpitations.  Gastrointestinal:  Positive for abdominal pain, diarrhea and nausea. Negative for vomiting.  Genitourinary:  Negative for dysuria and hematuria.  Musculoskeletal:  Negative for arthralgias and back pain.  Skin:  Negative for color change and rash.  Neurological:  Negative for seizures and syncope.  All other systems reviewed and are negative.   Physical Exam Updated Vital Signs BP 122/81   Pulse 71   Temp 98 F (36.7 C)   Resp 18   Ht '5\' 4"'$  (1.626 m)   Wt 63.6 kg   LMP 08/03/2021 (Exact Date)   SpO2 100%   BMI 24.07 kg/m  Physical Exam Vitals and nursing note reviewed.  Constitutional:      General: She is not in acute distress.    Appearance: She is well-developed.  HENT:     Head: Normocephalic and atraumatic.  Eyes:     Conjunctiva/sclera: Conjunctivae normal.  Cardiovascular:     Rate and Rhythm: Normal rate and regular rhythm.  Heart sounds: No murmur heard. Pulmonary:     Effort: Pulmonary effort is normal. No respiratory distress.     Breath sounds: Normal breath sounds.  Abdominal:     Palpations: Abdomen is soft.     Tenderness: There is abdominal tenderness in the left lower quadrant. There is no guarding or rebound.  Musculoskeletal:        General: No swelling.     Cervical back: Neck supple.  Skin:    General: Skin is warm and dry.     Capillary Refill: Capillary refill takes less than 2 seconds.  Neurological:     Mental Status: She is alert.  Psychiatric:        Mood and Affect: Mood normal.     ED Results / Procedures  / Treatments   Labs (all labs ordered are listed, but only abnormal results are displayed) Labs Reviewed  COMPREHENSIVE METABOLIC PANEL - Abnormal; Notable for the following components:      Result Value   Glucose, Bld 111 (*)    AST 14 (*)    All other components within normal limits  URINALYSIS, ROUTINE W REFLEX MICROSCOPIC - Abnormal; Notable for the following components:   Leukocytes,Ua SMALL (*)    All other components within normal limits  URINALYSIS, MICROSCOPIC (REFLEX) - Abnormal; Notable for the following components:   Bacteria, UA FEW (*)    All other components within normal limits  LIPASE, BLOOD  CBC  PREGNANCY, URINE    EKG None  Radiology CT ABDOMEN PELVIS W CONTRAST  Result Date: 08/24/2021 CLINICAL DATA:  Abdominal pain. EXAM: CT ABDOMEN AND PELVIS WITH CONTRAST TECHNIQUE: Multidetector CT imaging of the abdomen and pelvis was performed using the standard protocol following bolus administration of intravenous contrast. RADIATION DOSE REDUCTION: This exam was performed according to the departmental dose-optimization program which includes automated exposure control, adjustment of the mA and/or kV according to patient size and/or use of iterative reconstruction technique. CONTRAST:  151m OMNIPAQUE IOHEXOL 300 MG/ML  SOLN COMPARISON:  None Available. FINDINGS: Lower chest: The visualized lung bases are clear. No intra-abdominal free air or free fluid. Hepatobiliary: No focal liver abnormality is seen. No gallstones, gallbladder wall thickening, or biliary dilatation. Pancreas: Unremarkable. No pancreatic ductal dilatation or surrounding inflammatory changes. Spleen: Normal in size without focal abnormality. Adrenals/Urinary Tract: Adrenal glands are unremarkable. Kidneys are normal, without renal calculi, focal lesion, or hydronephrosis. Bladder is unremarkable. Stomach/Bowel: There is no bowel obstruction or active inflammation. The appendix is not visualized with certainty.  No inflammatory changes identified in the right lower quadrant. Vascular/Lymphatic: The abdominal aorta and IVC unremarkable. No portal venous gas. There is no adenopathy. Reproductive: The uterus is grossly unremarkable no adnexal masses. Other: None Musculoskeletal: L3-L4 posterior fusion. No acute osseous pathology. IMPRESSION: No acute intra-abdominal or pelvic pathology. Electronically Signed   By: AAnner CreteM.D.   On: 08/24/2021 23:08    Procedures Procedures    Medications Ordered in ED Medications  sodium chloride 0.9 % bolus 1,000 mL (1,000 mLs Intravenous New Bag/Given 08/24/21 2225)  ondansetron (ZOFRAN) injection 4 mg (4 mg Intravenous Given 08/24/21 2226)  ketorolac (TORADOL) 15 MG/ML injection 15 mg (15 mg Intravenous Given 08/24/21 2226)  iohexol (OMNIPAQUE) 300 MG/ML solution 100 mL (100 mLs Intravenous Contrast Given 08/24/21 2240)    ED Course/ Medical Decision Making/ A&P  Medical Decision Making Amount and/or Complexity of Data Reviewed Labs: ordered. Radiology: ordered.  Risk Prescription drug management.   11:42 PM 46 yo female with PMH of IBS presenting for abdominal pain. Pt is Aox3, no acute distress, afebrile, with stable vitals. Physical exam demonstrates soft abdomen with tenderness to deep palpation left lower quadrant.   IV fluids, Toradol, and zofran given for symptomatic management.   Laboratory studies demonstrate stable liver profile, lipase, renal function.  UA demonstrates no urinary tract infection.  No leukocytosis.  No signs or symptoms of sepsis.  CT abdomen demonstrates no diverticulitis.  No bowel obstruction.  No free air.  No colitis.  No acute process.  On reevaluation patient midst to improvement of symptoms.  Patient has establish care with GI specialist Dr. Collene Mares for IBS.  Recommend for close follow-up.  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  Patient in no distress and overall condition improved here in the ED. Detailed discussions were had with the patient regarding current findings, and need for close f/u with PCP or on call doctor. The patient has been instructed to return immediately if the symptoms worsen in any way for re-evaluation. Patient verbalized understanding and is in agreement with current care plan. All questions answered prior to discharge.         Final Clinical Impression(s) / ED Diagnoses Final diagnoses:  Left lower quadrant abdominal pain  Nausea vomiting and diarrhea  History of IBS    Rx / DC Orders ED Discharge Orders          Ordered    ondansetron (ZOFRAN) 4 MG tablet  Every 6 hours        08/24/21 2339              Lianne Cure, DO 49/70/26 2342

## 2021-08-24 NOTE — ED Triage Notes (Signed)
Reported recent travel to [place] and [place]; returned due to feeling ill; abdominal pain, along w/ nausea and some diarrhea; stated she left the country on the 10th and was having some discomfort then. Stated was seen at a hospital in [place] and was given IV fluids.

## 2021-08-24 NOTE — Discharge Instructions (Signed)
  Take Pedialyte if you continue to have diarrhea replenish electrolytes.  Continue to attempt to stay hydrated. Zofran was sent to your pharmacy for nausea. Follow the BRAT diet for diarrhea.  Bread, rice, applesauce, toast.  Bland foods.  Follow-up with your established GI specialist in the next 3 to 5 days if symptoms continue.

## 2021-08-26 ENCOUNTER — Other Ambulatory Visit (HOSPITAL_COMMUNITY): Payer: Self-pay | Admitting: Gastroenterology

## 2021-08-26 DIAGNOSIS — R110 Nausea: Secondary | ICD-10-CM

## 2021-08-30 ENCOUNTER — Ambulatory Visit (HOSPITAL_COMMUNITY)
Admission: RE | Admit: 2021-08-30 | Discharge: 2021-08-30 | Disposition: A | Discharge status: Home / Self Care | Payer: No Typology Code available for payment source | Source: Ambulatory Visit | Attending: Gastroenterology | Admitting: Gastroenterology

## 2021-08-30 DIAGNOSIS — R110 Nausea: Secondary | ICD-10-CM | POA: Insufficient documentation

## 2021-09-13 ENCOUNTER — Ambulatory Visit (HOSPITAL_COMMUNITY)
Admission: RE | Admit: 2021-09-13 | Discharge: 2021-09-13 | Disposition: A | Discharge status: Home / Self Care | Payer: No Typology Code available for payment source | Source: Ambulatory Visit | Attending: Obstetrics & Gynecology | Admitting: Obstetrics & Gynecology

## 2021-09-13 DIAGNOSIS — E041 Nontoxic single thyroid nodule: Secondary | ICD-10-CM | POA: Insufficient documentation

## 2022-01-19 ENCOUNTER — Ambulatory Visit (INDEPENDENT_AMBULATORY_CARE_PROVIDER_SITE_OTHER): Payer: No Typology Code available for payment source

## 2022-01-19 ENCOUNTER — Ambulatory Visit
Admission: EM | Admit: 2022-01-19 | Discharge: 2022-01-19 | Disposition: A | Discharge status: Home / Self Care | Payer: No Typology Code available for payment source

## 2022-01-19 DIAGNOSIS — J101 Influenza due to other identified influenza virus with other respiratory manifestations: Secondary | ICD-10-CM | POA: Diagnosis not present

## 2022-01-19 DIAGNOSIS — R053 Chronic cough: Secondary | ICD-10-CM

## 2022-01-19 DIAGNOSIS — J4520 Mild intermittent asthma, uncomplicated: Secondary | ICD-10-CM

## 2022-01-19 DIAGNOSIS — J019 Acute sinusitis, unspecified: Secondary | ICD-10-CM

## 2022-01-19 LAB — POCT INFLUENZA A/B
Influenza A, POC: POSITIVE — AB
Influenza B, POC: NEGATIVE

## 2022-01-19 MED ORDER — OSELTAMIVIR PHOSPHATE 75 MG PO CAPS: 75.0000 mg | ORAL_CAPSULE | Freq: Two times a day (BID) | ORAL | 0 refills | Status: DC

## 2022-01-19 MED ORDER — PREDNISONE 20 MG PO TABS: ORAL_TABLET | ORAL | 0 refills | Status: DC

## 2022-01-19 NOTE — ED Provider Notes (Signed)
[facility]  Note:  This document was prepared using Systems analyst and may include unintentional dictation errors.  MRN: [id number] DOB: 12-Jun-1975  Subjective:   Toni Frank is a 46 y.o. female presenting for 2-week history of persistent malaise and worsening coughing.  Continues to have chills and some headache.  Was diagnosed with sinus infection and started on amoxicillin on 01/14/2022.  Unfortunately her symptoms persist.  The sinus symptoms are improved but she is concerned about her coughing becoming worse.  Has a history of mild asthma, uses albuterol inhaler as needed but not recently.  No smoking, drug use, vaping.  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   Current Facility-Administered Medications:    EPINEPHrine (ADRENALIN) injection 0.2 mg, 0.2 mg, Intramuscular, Once, Robyn Haber, MD  Current Outpatient Medications:    gabapentin (NEURONTIN) 100 MG capsule, Take 100 mg by mouth daily., Disp: , Rfl:    azithromycin (ZITHROMAX) 250 MG tablet, Take 1 tablet (250 mg total) by mouth daily. Take first 2 tablets together, then 1 every day until finished., Disp: 6 tablet, Rfl: 0   cetirizine (ZYRTEC) 10 MG tablet, Take 10 mg by mouth daily., Disp: , Rfl:    dextromethorphan-guaiFENesin (MUCINEX DM) 30-600 MG 12hr tablet, Take 1 tablet by mouth 2 (two) times daily., Disp: 20 tablet, Rfl: 0   EPINEPHrine (EPIPEN) 0.3 mg/0.3 mL SOAJ injection, Inject 0.3 mLs (0.3 mg total) into the muscle once., Disp: 2 Device, Rfl: 3   ondansetron (ZOFRAN) 4 MG tablet, Take 1 tablet (4 mg total) by mouth every 6 (six) hours., Disp: 12 tablet, Rfl: 0   oseltamivir (TAMIFLU) 75 MG capsule, Take 75 mg by mouth 2 (two) times daily., Disp: , Rfl:    Allergies  Allergen Reactions   Cortisone Other (See Comments)    Gained cushings    Sulfonamide Derivatives     REACTION: hives    Past Medical  History:  Diagnosis Date   ADD (attention deficit disorder with hyperactivity)    Allergy    acute - cats   Asthma    IBS (irritable bowel syndrome)    Irregular periods    Migraines    PVC (premature ventricular contraction)      Past Surgical History:  Procedure Laterality Date   GYNECOLOGIC CRYOSURGERY     SPINE SURGERY     TUBAL LIGATION      Family History  Problem Relation Age of Onset   Heart disease Other    Cancer Other        liver   Colon polyps Other    GI problems Other        IBS   Cervical cancer Maternal Grandmother     Social History   Tobacco Use   Smoking status: Former   Smokeless tobacco: Never  Scientific laboratory technician Use: Never used  Substance Use Topics   Alcohol use: No   Drug use: No    ROS   Objective:   Vitals: BP 128/79 (BP Location: Left Arm)   Pulse 93   Temp 98.4 F (36.9 C) (Axillary)   Resp 16   LMP 01/01/2022   SpO2 98%   Physical Exam Constitutional:      General: She is not in acute distress.    Appearance: Normal appearance. She is well-developed. She is not ill-appearing, toxic-appearing or diaphoretic.  HENT:     Head:  Normocephalic and atraumatic.     Nose: Nose normal.     Mouth/Throat:     Mouth: Mucous membranes are moist.  Eyes:     General: No scleral icterus.       Right eye: No discharge.        Left eye: No discharge.     Extraocular Movements: Extraocular movements intact.  Cardiovascular:     Rate and Rhythm: Normal rate and regular rhythm.     Heart sounds: Normal heart sounds. No murmur heard.    No friction rub. No gallop.  Pulmonary:     Effort: Pulmonary effort is normal. No respiratory distress.     Breath sounds: No stridor. No wheezing, rhonchi or rales.  Chest:     Chest wall: No tenderness.  Skin:    General: Skin is warm and dry.  Neurological:     General: No focal deficit present.     Mental Status: She is alert and oriented to person, place, and time.  Psychiatric:         Mood and Affect: Mood normal.        Behavior: Behavior normal.    DG Chest 2 View  Result Date: 01/19/2022 CLINICAL DATA:  Cough.  Decreased lung sounds. EXAM: CHEST - 2 VIEW COMPARISON:  Chest two views 01/27/2020 FINDINGS: Cardiac silhouette and mediastinal contours are within normal limits. The lungs are clear. No pleural effusion or pneumothorax. Minimal partial visualization of lumbar spine posterior fusion hardware. Mild degenerative disc changes of the midthoracic spine. IMPRESSION: No active cardiopulmonary disease. Electronically Signed   By: Yvonne Kendall M.D.   On: 01/19/2022 14:53    Results for orders placed or performed during the hospital encounter of 01/19/22 (from the past 24 hour(s))  POCT Influenza A/B     Status: Abnormal   Collection Time: 01/19/22  2:42 PM  Result Value Ref Range   Influenza A, POC Positive (A) Negative   Influenza B, POC Negative Negative    Assessment and Plan :   PDMP not reviewed this encounter.  1. Influenza A   2. Persistent cough   3. Acute sinusitis, recurrence not specified, unspecified location   4. Mild intermittent asthma without complication     Given her asthma as a significant risk factor for flulike complications recommended Tamiflu and prednisone.  Otherwise, chest x-ray negative for pneumonia.  Patient is to finish out her amoxicillin for her sinusitis.  Use supportive care otherwise. Counseled patient on potential for adverse effects with medications prescribed/recommended today, ER and return-to-clinic precautions discussed, patient verbalized understanding.    Jaynee Eagles, [place] 01/19/22 1523

## 2022-01-19 NOTE — ED Triage Notes (Addendum)
Pt c/o chills, headache and cough. She states she was prescribed amoxicillin for sinus infection on 01/14/22.  Started: 01/07/22  Home interventions: tessalon pearls, amoxicillin     Pt c/o lower back pain for a few days.  Home interventions: advil

## 2023-05-20 ENCOUNTER — Ambulatory Visit (HOSPITAL_BASED_OUTPATIENT_CLINIC_OR_DEPARTMENT_OTHER)

## 2023-09-08 ENCOUNTER — Encounter (HOSPITAL_BASED_OUTPATIENT_CLINIC_OR_DEPARTMENT_OTHER): Payer: Self-pay

## 2023-09-08 ENCOUNTER — Ambulatory Visit (HOSPITAL_BASED_OUTPATIENT_CLINIC_OR_DEPARTMENT_OTHER)

## 2023-09-08 ENCOUNTER — Ambulatory Visit (HOSPITAL_BASED_OUTPATIENT_CLINIC_OR_DEPARTMENT_OTHER)
Admission: RE | Admit: 2023-09-08 | Discharge: 2023-09-08 | Disposition: A | Discharge status: Home / Self Care | Source: Ambulatory Visit

## 2023-09-08 VITALS — BP 122/80 | HR 70 | Temp 98.4°F | Resp 20

## 2023-09-08 DIAGNOSIS — G4452 New daily persistent headache (NDPH): Secondary | ICD-10-CM | POA: Diagnosis not present

## 2023-09-08 MED ORDER — DEXAMETHASONE SODIUM PHOSPHATE 10 MG/ML IJ SOLN: 10.0000 mg | Freq: Once | INTRAMUSCULAR | Status: DC

## 2023-09-08 MED ORDER — DEXAMETHASONE SODIUM PHOSPHATE 10 MG/ML IJ SOLN
10.0000 mg | Freq: Once | INTRAMUSCULAR | Status: AC
  Administered 2023-09-08: 10 mg via INTRAMUSCULAR

## 2023-09-08 NOTE — Discharge Instructions (Signed)
 I am giving you a shot here to hopefully help with your headache If the headache does not improve or worsens you will need to go to the ER for a CT scan. Recommend [facility]

## 2023-09-08 NOTE — ED Triage Notes (Signed)
 Headache x 9 days. States I wake up with it and it sticks with me all day. Taking 3 ibuprofen and 1 tylenol  every morning. States hx of migraines and this is not typical of her migraines. Called pcp today and was told to come to urgent care for evaluation. States pain currently from middle of nose, radiation toward top of head, base of skull and left eye. Took her migraine medication (imitrex) this morning with no relief.

## 2023-09-09 ENCOUNTER — Emergency Department (HOSPITAL_BASED_OUTPATIENT_CLINIC_OR_DEPARTMENT_OTHER)

## 2023-09-09 ENCOUNTER — Other Ambulatory Visit: Payer: Self-pay

## 2023-09-09 ENCOUNTER — Emergency Department (HOSPITAL_BASED_OUTPATIENT_CLINIC_OR_DEPARTMENT_OTHER)
Admission: EM | Admit: 2023-09-09 | Discharge: 2023-09-09 | Disposition: A | Discharge status: Home / Self Care | Attending: Emergency Medicine | Admitting: Emergency Medicine

## 2023-09-09 ENCOUNTER — Encounter (HOSPITAL_BASED_OUTPATIENT_CLINIC_OR_DEPARTMENT_OTHER): Payer: Self-pay | Admitting: Emergency Medicine

## 2023-09-09 DIAGNOSIS — D72829 Elevated white blood cell count, unspecified: Secondary | ICD-10-CM | POA: Insufficient documentation

## 2023-09-09 DIAGNOSIS — R519 Headache, unspecified: Secondary | ICD-10-CM | POA: Diagnosis present

## 2023-09-09 LAB — CBC WITH DIFFERENTIAL/PLATELET
Abs Immature Granulocytes: 0.06 K/uL (ref 0.00–0.07)
Basophils Absolute: 0 K/uL (ref 0.0–0.1)
Basophils Relative: 0 %
Eosinophils Absolute: 0 K/uL (ref 0.0–0.5)
Eosinophils Relative: 0 %
HCT: 40.7 % (ref 36.0–46.0)
Hemoglobin: 13.5 g/dL (ref 12.0–15.0)
Immature Granulocytes: 1 %
Lymphocytes Relative: 9 %
Lymphs Abs: 1 K/uL (ref 0.7–4.0)
MCH: 31.2 pg (ref 26.0–34.0)
MCHC: 33.2 g/dL (ref 30.0–36.0)
MCV: 94 fL (ref 80.0–100.0)
Monocytes Absolute: 0.7 K/uL (ref 0.1–1.0)
Monocytes Relative: 6 %
Neutro Abs: 9.5 K/uL — ABNORMAL HIGH (ref 1.7–7.7)
Neutrophils Relative %: 84 %
Platelets: 304 K/uL (ref 150–400)
RBC: 4.33 MIL/uL (ref 3.87–5.11)
RDW: 12.7 % (ref 11.5–15.5)
WBC: 11.3 K/uL — ABNORMAL HIGH (ref 4.0–10.5)
nRBC: 0 % (ref 0.0–0.2)

## 2023-09-09 LAB — BASIC METABOLIC PANEL WITH GFR
Anion gap: 12 (ref 5–15)
BUN: 18 mg/dL (ref 6–20)
CO2: 26 mmol/L (ref 22–32)
Calcium: 10.1 mg/dL (ref 8.9–10.3)
Chloride: 102 mmol/L (ref 98–111)
Creatinine, Ser: 0.66 mg/dL (ref 0.44–1.00)
GFR, Estimated: >60 mL/min (ref >60)
Glucose, Bld: 115 mg/dL — ABNORMAL HIGH (ref 70–99)
Potassium: 4.5 mmol/L (ref 3.5–5.1)
Sodium: 140 mmol/L (ref 135–145)

## 2023-09-09 MED ORDER — METOCLOPRAMIDE HCL 5 MG/ML IJ SOLN
5.0000 mg | Freq: Once | INTRAMUSCULAR | Status: AC
  Administered 2023-09-09: 5 mg via INTRAVENOUS
  Filled 2023-09-09: qty 2

## 2023-09-09 MED ORDER — DIPHENHYDRAMINE HCL 50 MG/ML IJ SOLN
12.5000 mg | Freq: Once | INTRAMUSCULAR | Status: AC
  Administered 2023-09-09: 12.5 mg via INTRAVENOUS
  Filled 2023-09-09: qty 1

## 2023-09-09 MED ORDER — ONDANSETRON HCL 4 MG/2ML IJ SOLN
4.0000 mg | Freq: Once | INTRAMUSCULAR | Status: AC
  Administered 2023-09-09: 4 mg via INTRAVENOUS
  Filled 2023-09-09: qty 2

## 2023-09-09 MED ORDER — MAGNESIUM SULFATE 2 GM/50ML IV SOLN
2.0000 g | Freq: Once | INTRAVENOUS | Status: AC
  Administered 2023-09-09: 2 g via INTRAVENOUS
  Filled 2023-09-09: qty 50

## 2023-09-09 MED ORDER — KETOROLAC TROMETHAMINE 30 MG/ML IJ SOLN
30.0000 mg | Freq: Once | INTRAMUSCULAR | Status: AC
  Administered 2023-09-09: 30 mg via INTRAVENOUS
  Filled 2023-09-09: qty 1

## 2023-09-09 MED ORDER — SODIUM CHLORIDE 0.9 % IV BOLUS
1000.0000 mL | Freq: Once | INTRAVENOUS | Status: AC
  Administered 2023-09-09: 1000 mL via INTRAVENOUS

## 2023-09-09 MED ORDER — DEXAMETHASONE SODIUM PHOSPHATE 10 MG/ML IJ SOLN
10.0000 mg | Freq: Once | INTRAMUSCULAR | Status: AC
  Administered 2023-09-09: 10 mg via INTRAVENOUS
  Filled 2023-09-09: qty 1

## 2023-09-09 NOTE — ED Notes (Signed)
 Pt alert and oriented X 4 at the time of discharge. RR even and unlabored. No acute distress noted. Pt verbalized understanding of discharge instructions as discussed. Pt ambulatory to lobby at time of discharge.

## 2023-09-09 NOTE — ED Triage Notes (Signed)
 Migraine. Treated at [facility] yesterday, given Decadron  IM, with no relief. Nauseated

## 2023-09-09 NOTE — ED Provider Notes (Signed)
 [facility] Provider Note   CSN: [id number] Arrival date & time: 09/09/23  9195     Patient presents with: Migraine   Toni Frank is a 48 y.o. female.   HPI   48 year old female with past medical history of migraines presents emergency department with ongoing headache.  Patient states that she typically gets migraines around her menstrual period.  Recently was transition to Imitrex by her OB/GYN for control.  Currently she has a headache that has been ongoing for a couple days.  Noticeable at night, making sleep difficult.  This current headache is lasting longer than previous ones, not responding to Imitrex.  The pain pattern varies.  She describes pain originating from the face, radiating back to the occiput, initially affecting the left side of the head now affecting the right.  No neck stiffness, fever.  No associated neurosymptoms.  Continues to have light and sound sensitivity.  Mild nausea without vomiting.  Has never seen a neurologist in regards to frequent headaches.  Prior to Admission medications   Medication Sig Start Date End Date Taking? Authorizing Provider  butalbital-acetaminophen -caffeine (FIORICET) 50-325-40 MG tablet Take 1 tablet by mouth every 4 (four) hours as needed for headache.    [provider]  cetirizine (ZYRTEC) 10 MG tablet Take 10 mg by mouth daily.    [provider]  EPINEPHrine  (EPIPEN ) 0.3 mg/0.3 mL SOAJ injection Inject 0.3 mLs (0.3 mg total) into the muscle once. 12/24/12   Krystal Reyes DELENA, MD  SUMAtriptan (IMITREX) 50 MG tablet Take 50 mg by mouth every 2 (two) hours as needed.    [provider]  zolpidem  (AMBIEN ) 10 MG tablet Take 10 mg by mouth at bedtime. 12/09/21   [provider]  levothyroxine (SYNTHROID) 50 MCG tablet Take 50 mcg by mouth daily.  01/27/20  [provider]    Allergies: Cortisone and Sulfonamide derivatives    Review of Systems   Constitutional:  Positive for appetite change and fatigue. Negative for fever.  Eyes:  Positive for photophobia.  Respiratory:  Negative for shortness of breath.   Cardiovascular:  Negative for chest pain.  Gastrointestinal:  Negative for abdominal pain, diarrhea and vomiting.  Musculoskeletal:  Negative for neck pain and neck stiffness.  Skin:  Negative for rash.  Neurological:  Positive for headaches. Negative for dizziness, tremors, syncope, speech difficulty, weakness, light-headedness and numbness.    Updated Vital Signs BP 124/83   Pulse 73   Temp 97.8 F (36.6 C) (Oral)   Resp 18   LMP 08/20/2023   SpO2 97%   Physical Exam Vitals and nursing note reviewed.  Constitutional:      General: She is not in acute distress.    Appearance: Normal appearance.  HENT:     Head: Normocephalic.     Mouth/Throat:     Mouth: Mucous membranes are moist.  Eyes:     Extraocular Movements: Extraocular movements intact.     Pupils: Pupils are equal, round, and reactive to light.  Cardiovascular:     Rate and Rhythm: Normal rate.  Pulmonary:     Effort: Pulmonary effort is normal. No respiratory distress.  Abdominal:     Palpations: Abdomen is soft.     Tenderness: There is no abdominal tenderness.  Skin:    General: Skin is warm.  Neurological:     General: No focal deficit present.     Mental Status: She is alert and oriented to person,  place, and time. Mental status is at baseline.  Psychiatric:        Mood and Affect: Mood normal.     (all labs ordered are listed, but only abnormal results are displayed) Labs Reviewed  CBC WITH DIFFERENTIAL/PLATELET  BASIC METABOLIC PANEL WITH GFR    EKG: None  Radiology: No results found.   Procedures   Medications Ordered in the ED  magnesium  sulfate IVPB 2 g 50 mL (2 g Intravenous New Bag/Given 09/09/23 0920)  sodium chloride  0.9 % bolus 1,000 mL (1,000 mLs Intravenous New Bag/Given 09/09/23 9077)  ketorolac  (TORADOL ) 30 MG/ML  injection 30 mg (30 mg Intravenous Given 09/09/23 0917)  ondansetron  (ZOFRAN ) injection 4 mg (4 mg Intravenous Given 09/09/23 9082)                                    Medical Decision Making Amount and/or Complexity of Data Reviewed Labs: ordered. Radiology: ordered.  Risk Prescription drug management.   48 year old female presents emergency department with concern for ongoing migraine that has slight different characteristics than her previous migraines.  Is being followed by OB/GYN, who prescribed her Imitrex that she has not had relief from.  Is never followed with neurology.  Denies any neurosymptoms, fever, neck stiffness.  Vitals are normal and stable, she is neuro intact, overall well-appearing.  Blood work is reassuring, CT of the head shows no acute finding.  After IV medications, migraine cocktail patient feels improved.  Have recommended establishing care with neurology for outpatient follow-up.  Patient understands and agrees.  Patient at this time appears safe and stable for discharge and close outpatient follow up. Discharge plan and strict return to ED precautions discussed, patient verbalizes understanding and agreement.     Final diagnoses:  None    ED Discharge Orders     None          Bari Roxie HERO, DO 09/09/23 1430

## 2023-09-09 NOTE — Discharge Instructions (Signed)
 You have been seen and discharged from the emergency department.  Your blood work and head CT did not show any concerning finding.  You were treated with IV medicine.  It is important to establish care with neurology for further diagnosis/recommendations in regards to frequent migraine headaches.  Follow-up with your primary provider for further evaluation and further care. Take home medications as prescribed. If you have any worsening symptoms or further concerns for your health please return to an emergency department for further evaluation.

## 2023-09-09 NOTE — ED Provider Notes (Signed)
 [facility]    CSN: [id number] Arrival date & time: 09/08/23  1726      History   Chief Complaint Chief Complaint  Patient presents with   Headache    Entered by patient    HPI Toni Frank is a 48 y.o. female.   Patient is a 48 year old female that presents today with new daily persistent headache.  Headache x 9 days. States I wake up with it and it sticks with me all day. Taking 3 ibuprofen and 1 tylenol  every morning. States hx of migraines and this is not typical of her migraines. Called pcp today and was told to come to urgent care for evaluation. States pain currently from middle of nose, radiation toward top of head, base of skull and left eye. Took her migraine medication (imitrex) this morning with no relief.  The pain wakes her up in the melanite at times.  She denies any nasal congestion, rhinorrhea.  She does get nauseous at times.  Mild photophobia   Headache   Past Medical History:  Diagnosis Date   ADD (attention deficit disorder with hyperactivity)    Allergy    acute - cats   Asthma    IBS (irritable bowel syndrome)    Irregular periods    Migraines    PVC (premature ventricular contraction)     Patient Active Problem List   Diagnosis Date Noted   Hypothyroidism 01/30/2019   Palpitations 01/29/2015   Fibrocystic breast disease in female 07/13/2012   ADHD (attention deficit hyperactivity disorder) 05/03/2011   Dysplastic nevus of trunk 03/22/2011   [facility] DISEASE 04/16/2009    Past Surgical History:  Procedure Laterality Date   GYNECOLOGIC CRYOSURGERY     SPINE SURGERY     TUBAL LIGATION      OB History   No obstetric history on file.      Home Medications    Prior to Admission medications   Medication Sig Start Date End Date Taking? Authorizing Provider  zolpidem  (AMBIEN ) 10 MG tablet Take 10 mg by mouth at bedtime. 12/09/21  Yes [provider]  butalbital-acetaminophen -caffeine (FIORICET) 50-325-40  MG tablet Take 1 tablet by mouth every 4 (four) hours as needed for headache.    [provider]  cetirizine (ZYRTEC) 10 MG tablet Take 10 mg by mouth daily.    [provider]  EPINEPHrine  (EPIPEN ) 0.3 mg/0.3 mL SOAJ injection Inject 0.3 mLs (0.3 mg total) into the muscle once. 12/24/12   Krystal Reyes DELENA, MD  SUMAtriptan (IMITREX) 50 MG tablet Take 50 mg by mouth every 2 (two) hours as needed.    [provider]  levothyroxine (SYNTHROID) 50 MCG tablet Take 50 mcg by mouth daily.  01/27/20  [provider]    Family History Family History  Problem Relation Age of Onset   Heart disease Other    Cancer Other        liver   Colon polyps Other    GI problems Other        IBS   Cervical cancer Maternal Grandmother     Social History Social History   Tobacco Use   Smoking status: Former   Smokeless tobacco: Never  Vaping Use   Vaping status: Never Used  Substance Use Topics   Alcohol use: No   Drug use: No     Allergies   Cortisone and Sulfonamide derivatives   Review of Systems Review of Systems  Neurological:  Positive for headaches.  Physical Exam Triage Vital Signs ED Triage Vitals  Encounter Vitals Group     BP 09/08/23 1745 122/80     Girls Systolic BP Percentile --      Girls Diastolic BP Percentile --      Boys Systolic BP Percentile --      Boys Diastolic BP Percentile --      Pulse Rate 09/08/23 1745 70     Resp 09/08/23 1745 20     Temp 09/08/23 1745 98.4 F (36.9 C)     Temp Source 09/08/23 1745 Oral     SpO2 09/08/23 1745 98 %     Weight --      Height --      Head Circumference --      Peak Flow --      Pain Score 09/08/23 1748 8     Pain Loc --      Pain Education --      Exclude from Growth Chart --    No data found.  Updated Vital Signs BP 122/80 (BP Location: Right Arm)   Pulse 70   Temp 98.4 F (36.9 C) (Oral)   Resp 20   LMP 08/20/2023   SpO2 98%   Visual Acuity Right Eye Distance:    Left Eye Distance:   Bilateral Distance:    Right Eye Near:   Left Eye Near:    Bilateral Near:     Physical Exam Vitals and nursing note reviewed.  Constitutional:      General: She is not in acute distress.    Appearance: Normal appearance. She is not ill-appearing, toxic-appearing or diaphoretic.  HENT:     Right Ear: Tympanic membrane, ear canal and external ear normal.     Left Ear: Tympanic membrane, ear canal and external ear normal.     Nose: Nose normal.     Mouth/Throat:     Pharynx: Oropharynx is clear.  Eyes:     Extraocular Movements: Extraocular movements intact.     Conjunctiva/sclera: Conjunctivae normal.     Pupils: Pupils are equal, round, and reactive to light.  Pulmonary:     Effort: Pulmonary effort is normal.  Musculoskeletal:        General: Normal range of motion.  Skin:    General: Skin is warm and dry.  Neurological:     General: No focal deficit present.     Mental Status: She is alert.     Cranial Nerves: No cranial nerve deficit.     Sensory: No sensory deficit.     Motor: No weakness.     Coordination: Coordination normal.     Gait: Gait normal.     Deep Tendon Reflexes: Reflexes normal.  Psychiatric:        Mood and Affect: Mood normal.        Behavior: Behavior normal.        Thought Content: Thought content normal.        Judgment: Judgment normal.      UC Treatments / Results  Labs (all labs ordered are listed, but only abnormal results are displayed) Labs Reviewed - No data to display  EKG   Radiology CT Head Wo Contrast Result Date: 09/09/2023 CLINICAL DATA:  Headache. Treated with Decadron  yesterday with no relief. Nausea. EXAM: CT HEAD WITHOUT CONTRAST TECHNIQUE: Contiguous axial images were obtained from the base of the skull through the vertex without intravenous contrast. RADIATION DOSE REDUCTION: This exam was performed according to the departmental dose-optimization  program which includes automated exposure control,  adjustment of the mA and/or kV according to patient size and/or use of iterative reconstruction technique. COMPARISON:  None Available. FINDINGS: Brain: No evidence of acute infarction, hemorrhage, hydrocephalus, extra-axial collection or mass lesion/mass effect. Vascular: No hyperdense vessel or unexpected calcification. Skull: Normal. Negative for fracture or focal lesion. Sinuses/Orbits: No acute finding. Other: None. IMPRESSION: No acute intracranial pathology. Electronically Signed   By: Toribio Agreste M.D.   On: 09/09/2023 10:05    Procedures Procedures (including critical care time)  Medications Ordered in UC Medications  dexamethasone  (DECADRON ) injection 10 mg (10 mg Intramuscular Given 09/08/23 1820)    Initial Impression / Assessment and Plan / UC Course  I have reviewed the triage vital signs and the nursing notes.  Pertinent labs & imaging results that were available during my care of the patient were reviewed by me and considered in my medical decision making (see chart for details).     New daily persistent headache.-No specific concerns on exam or red flags.  Patient has had constant headache over 9 days which is not consistent with her typical migraines that she gets hormonally.  She has taken multiple different medications without any relief.  The pain is waking her up in the middle of the night.  We will try to treat today with Decadron  injection to see if this helps with the pain.  Recommend if it does not to go to the ER for a CT scan. Patient understanding and agreed to plan. Final Clinical Impressions(s) / UC Diagnoses   Final diagnoses:  New daily persistent headache     Discharge Instructions      I am giving you a shot here to hopefully help with your headache If the headache does not improve or worsens you will need to go to the ER for a CT scan. Recommend [facility]     ED Prescriptions   None    PDMP not reviewed this encounter.   Adah Wilbert LABOR, FNP 09/09/23 1326

## 2023-09-11 ENCOUNTER — Encounter: Payer: Self-pay | Admitting: Neurology

## 2023-10-22 NOTE — Progress Notes (Signed)
 Initial neurology clinic note  Toni Frank MRN: [id number] DOB: 1975-06-19  Referring provider: Bari Roxie HERO, DO  Primary care provider: Patient, No Pcp Per  Reason for consult:  headaches  Subjective:  This is Toni Frank, a 48 y.o. 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 The patient is alone today.  Patient's headaches started during puberty and were concentrated around her cycle. She started birth control and her headaches went away. When she stopped taking birth control, the headaches returned. She had been given Fioricet by OB/GYN that helped. She was told these were migraines.  She describes the headache as on the right or left or even a band around head. It can be a stabbing sensation through her eyes to head. It can be pulsating. She endorses associated photophobia, nausea, and vomiting. Her normal headache last 2-3 days. She gets 1 severe headache a month. She will get a headache that lasts 3-4 hours about 3-4 times per week for a total of ~15 headaches per month.   She does not have significant neck pain.  Patient went to ED for headaches on 09/09/23 that had been present for weeks. She got 2 migraine cocktails that eventually helped some. She mentions she is sensitive to steroids.  Currently, when she gets a headache, she will see what happens. If she feels nauseated, she will know it is a worse headache. She will then take Fioricet (4-5 per month) which will take the edge off. She will take Sumatriptan  50 mg daily if Fioricet is not helping. This can resolve her headache if taken at the right time. She has zofran  ODT for nausea.  Smoker: no OCP/hormone use: none Caffiene use: 1 cup of coffee in morning EtOH use: no Restrictive diet: no Family history of neurologic disease including headaches: son  has migraines; mother with migraines  MEDICATIONS:  Outpatient Encounter Medications as of 10/25/2023  Medication Sig   butalbital-acetaminophen -caffeine (FIORICET) 50-325-40 MG tablet Take 1 tablet by mouth every 4 (four) hours as needed for headache.   cetirizine (ZYRTEC) 10 MG tablet Take 10 mg by mouth daily. (Patient taking differently: Take 10 mg by mouth as needed.)   EPINEPHrine  (EPIPEN ) 0.3 mg/0.3 mL SOAJ injection Inject 0.3 mLs (0.3 mg total) into the muscle once.   SUMAtriptan  (IMITREX ) 50 MG tablet Take 50 mg by mouth every 2 (two) hours as needed.   zolpidem  (AMBIEN ) 10 MG tablet Take 10 mg by mouth at bedtime.   [DISCONTINUED] levothyroxine (SYNTHROID) 50 MCG tablet Take 50 mcg by mouth daily.   Facility-Administered Encounter Medications as of 10/25/2023  Medication   EPINEPHrine  (ADRENALIN ) injection 0.2 mg    PAST MEDICAL HISTORY: Past Medical History:  Diagnosis Date   ADD (attention deficit disorder with hyperactivity)    Allergy    acute - cats   Asthma    IBS (irritable bowel syndrome)    Irregular periods    Migraines    PVC (premature ventricular contraction)     PAST SURGICAL HISTORY: Past Surgical History:  Procedure Laterality Date   GYNECOLOGIC CRYOSURGERY     SPINE SURGERY     TUBAL LIGATION      ALLERGIES: Allergies  Allergen Reactions   Cortisone Other (See Comments)    Gained cushings    Sulfonamide Derivatives     REACTION: hives    FAMILY HISTORY: Family  History  Problem Relation Age of Onset   Obesity Father    Hypertension Father    Cervical cancer Maternal Grandmother    Heart disease Other    Cancer Other        liver   Colon polyps Other    GI problems Other        IBS    SOCIAL HISTORY: Social History   Tobacco Use   Smoking status: Former   Smokeless tobacco: Never  Vaping Use   Vaping status: Never Used  Substance Use Topics   Alcohol use: No   Drug use: No   Social History   Social History Narrative    Are you right handed or left handed? right   Are you currently employed ?    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   Who lives with you? family   What type of home do you live in: 1 story or 2 story? one    Caffiene coffee in am 1.    Objective:  Vital Signs:  BP 120/74   Pulse 73   Resp 18   Ht 5' 4 (1.626 m)   Wt 162 lb (73.5 kg)   SpO2 98%   BMI 27.81 kg/m   General: No acute distress.  Patient appears well-groomed.   Head:  Normocephalic/atraumatic Eyes:  fundi examined, disc margins clear, no obvious papilledema Neck: supple, no paraspinal tenderness, full range of motion Back: No paraspinal tenderness Heart: regular rate and rhythm Lungs: Clear to auscultation bilaterally. Vascular: No carotid bruits.  Neurological Exam: Mental status: alert and oriented, speech fluent and not dysarthric, language intact.  Cranial nerves: CN I: not tested CN II: pupils equal, round and reactive to light, visual fields intact CN III, IV, VI:  full range of motion, no nystagmus, no ptosis CN V: facial sensation intact. CN VII: upper and lower face symmetric CN VIII: hearing intact CN IX, X: uvula midline CN XI: sternocleidomastoid and trapezius muscles intact CN XII: tongue midline  Bulk & Tone: normal, no fasciculations. Motor:  muscle strength 5/5 throughout Deep Tendon Reflexes:  2+ throughout.   Sensation:  Light touch sensation intact. Finger to nose testing:  Without dysmetria.   Gait:  Normal station and stride.  Romberg negative.   Labs and Imaging review: Internal labs: 09/09/23: BMP significant for glucose 115 CBC w/ diff significant for WBC of 11.3  TSH (01/30/19) wnl  Imaging/Procedures: CT head wo contrast (09/09/23): Brain: No evidence of acute infarction, hemorrhage, hydrocephalus, extra-axial collection or mass lesion/mass effect.   Vascular: No hyperdense vessel or unexpected calcification.   Skull: Normal. Negative  for fracture or focal lesion.   Sinuses/Orbits: No acute finding.   Other: None.   IMPRESSION: No acute intracranial pathology.  Assessment/Plan:  Toni Frank is a 48 y.o. female who presents for evaluation of headaches. 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 Her neurological examination is normal today. Available diagnostic data is significant for CT head which was normal (09/2023). Patient's headaches sound most consistent with previous diagnosis of migraine, likely without aura. She is having 1 severe headache but 4 other headaches she discounts per week. It is likely she is having around 15 headaches per month currently. She would benefit from a preventative medication. Her son took topamax  previously, so she is comfortable trying this. For rescue, Fioricet is often associated  with medication overuse and rebound headaches and does not seem to work well for patient, so I will stop this. I will increase her Sumatriptan  to the max dose for rescue (100 mg).  PLAN: -Blood work: B12, TSH -For migraines: Migraine prevention:  Start Topamax  25 mg daily Migraine rescue:  Stop Fioricet. Increase Sumatriptan  100 mg as needed at headache onset, can repeat in 2 hours if needed. Zofran  ODT as needed for nausea Limit use of pain relievers to no more than 2 days out of week to prevent risk of rebound or medication-overuse headache. Keep headache diary  -Return to clinic in 3 months  The impression above as well as the plan as outlined below were extensively discussed with the patient who voiced understanding. All questions were answered to their satisfaction.  When available, results of the above investigations and possible further recommendations will be communicated to the patient via telephone/MyChart. Patient to call office if not contacted after expected testing turnaround time.   Total time  spent reviewing records, interview, history/exam, documentation, and coordination of care on day of encounter:  65 min   Thank you for allowing me to participate in patient's care.  If I can answer any additional questions, I would be pleased to do so.  Venetia Potters, MD   CC: Patient, No Pcp Per No address on file  CC: Referring provider: Horton, Kristie M, DO [address] [place],  [place] [address]

## 2023-10-25 ENCOUNTER — Other Ambulatory Visit

## 2023-10-25 ENCOUNTER — Ambulatory Visit (INDEPENDENT_AMBULATORY_CARE_PROVIDER_SITE_OTHER): Admitting: Neurology

## 2023-10-25 ENCOUNTER — Encounter: Payer: Self-pay | Admitting: Neurology

## 2023-10-25 VITALS — BP 120/74 | HR 73 | Resp 18 | Ht 64.0 in | Wt 162.0 lb

## 2023-10-25 DIAGNOSIS — G43709 Chronic migraine without aura, not intractable, without status migrainosus: Secondary | ICD-10-CM

## 2023-10-25 DIAGNOSIS — R112 Nausea with vomiting, unspecified: Secondary | ICD-10-CM | POA: Diagnosis not present

## 2023-10-25 MED ORDER — SUMATRIPTAN SUCCINATE 100 MG PO TABS: 100.0000 mg | ORAL_TABLET | ORAL | 5 refills | Status: DC | PRN

## 2023-10-25 MED ORDER — ONDANSETRON 4 MG PO TBDP: 4.0000 mg | ORAL_TABLET | Freq: Three times a day (TID) | ORAL | 0 refills | Status: AC | PRN

## 2023-10-25 MED ORDER — TOPIRAMATE 25 MG PO TABS: 25.0000 mg | ORAL_TABLET | Freq: Every day | ORAL | 5 refills | Status: DC

## 2023-10-25 NOTE — Patient Instructions (Addendum)
 I saw you today for migraines.  I will get some blood work today. I will be in touch when I have the results.  For your migraines: Migraine prevention:  Start Topamax  25 mg daily Migraine rescue:  Stop Fioricet. Increase Sumatriptan  to 100 mg as needed at headache onset, can repeat in 2 hours if needed. Zofran  under the tongue as needed for nausea Limit use of pain relievers to no more than 2 days out of week to prevent risk of rebound or medication-overuse headache. Keep headache diary  -Return to clinic in 3 months  Please let me know if you have any questions or concerns in the meantime.  The physicians and staff at [facility] are committed to providing excellent care. You may receive a survey requesting feedback about your experience at our office. We strive to receive very good responses to the survey questions. If you feel that your experience would prevent you from giving the office a very good  response, please contact our office to try to remedy the situation. We may be reached at [phone]. Thank you for taking the time out of your busy day to complete the survey.  Venetia Potters, MD [facility]  More migraine information: Be aware of common food triggers:  - Caffeine:  coffee, black tea, cola, Mt. Dew  - Chocolate  - Dairy:  aged cheeses (brie, blue, cheddar, gouda, Parmasan, provolone, romano, Swiss, etc), chocolate milk, buttermilk, sour cream, limit eggs and yogurt  - Nuts, peanut butter  - Alcohol  - Cereals/grains:  FRESH breads (fresh bagels, sourdough, doughnuts), yeast productions  - Processed/canned/aged/cured meats (pre-packaged deli meats, hotdogs)  - MSG/glutamate:  soy sauce, flavor enhancer, pickled/preserved/marinated foods  - Sweeteners:  aspartame (Equal, Nutrasweet).  Sugar and Splenda are okay  - Vegetables:  legumes (lima beans, lentils, snow peas, fava beans, pinto peans, peas, garbanzo beans), sauerkraut, onions, olives, pickles  -  Fruit:  avocados, bananas, citrus fruit (orange, lemon, grapefruit), mango  - Other:  Frozen meals, macaroni and cheese Routine exercise Stay adequately hydrated (aim for 64 oz water daily) Keep headache diary Maintain proper stress management Maintain proper sleep hygiene Do not skip meals Consider supplements:  magnesium  citrate 400mg  daily, riboflavin 400mg  daily, coenzyme Q10 100mg  three times daily.

## 2023-10-26 ENCOUNTER — Ambulatory Visit: Payer: Self-pay | Admitting: Neurology

## 2023-10-26 LAB — TSH: TSH: 1.08 m[IU]/L

## 2023-10-26 LAB — VITAMIN B12: Vitamin B-12: 367 pg/mL (ref 200–1100)

## 2023-11-27 ENCOUNTER — Encounter: Payer: Self-pay | Admitting: Neurology

## 2023-11-27 ENCOUNTER — Telehealth: Payer: Self-pay | Admitting: Neurology

## 2023-11-27 DIAGNOSIS — G43709 Chronic migraine without aura, not intractable, without status migrainosus: Secondary | ICD-10-CM

## 2023-11-27 MED ORDER — TOPIRAMATE 25 MG PO TABS: 50.0000 mg | ORAL_TABLET | Freq: Every day | ORAL | 5 refills | Status: DC

## 2023-11-27 MED ORDER — RIZATRIPTAN BENZOATE 10 MG PO TABS: 10.0000 mg | ORAL_TABLET | ORAL | 0 refills | Status: DC | PRN

## 2023-11-27 NOTE — Telephone Encounter (Signed)
 Caller: Jhoanna Heyde; self  PH: [phone]  Reason for the call:  [place] called in wanting to speak with Dr. Leigh or nurse. She stated that she has been experiencing a problem. She stated that she has a had headache for quite awhile and it's not getting any better. [place] also sent a Mychart message as well.

## 2023-11-27 NOTE — Telephone Encounter (Signed)
 Returned patient's call. She has had a constant headache of variable intensity since 11/06/23. It is not responding well to sumatriptan . She is still taking topamax  25 mg daily. She has not taken fiorcet but is tempted to as she thinks this will stop her headache.  After discussion, will do the following: -Gave patient the option of taking fiorcet now to see if this aborts her headache vs calling in a medrol  dose pak. Patient had a bad reaction to steroids in the ED, so would prefer to avoid this. She would rather take fiorcet now. -Stop sumatriptan . Will switch to rizatriptan 10 mg as needed at headache onset, can repeat in 2 hours if needed. -If patient still has a headache tomorrow, she will contact us  and try to make it to our office for a headache cocktail. -Increase preventative medication, topamax  to 50 mg at bedtime.   All questions were answered.  Venetia Potters, MD [facility]

## 2023-11-29 ENCOUNTER — Telehealth: Payer: Self-pay | Admitting: Neurology

## 2023-11-29 ENCOUNTER — Ambulatory Visit

## 2023-11-29 DIAGNOSIS — G43709 Chronic migraine without aura, not intractable, without status migrainosus: Secondary | ICD-10-CM | POA: Diagnosis not present

## 2023-11-29 MED ORDER — METOCLOPRAMIDE HCL 5 MG/ML IJ SOLN
10.0000 mg | Freq: Once | INTRAMUSCULAR | Status: AC
  Administered 2023-11-29: 10 mg via INTRAMUSCULAR

## 2023-11-29 MED ORDER — KETOROLAC TROMETHAMINE 60 MG/2ML IM SOLN
60.0000 mg | Freq: Once | INTRAMUSCULAR | Status: AC
  Administered 2023-11-29: 60 mg via INTRAMUSCULAR

## 2023-11-29 MED ORDER — DIPHENHYDRAMINE HCL 50 MG/ML IJ SOLN
50.0000 mg | Freq: Once | INTRAMUSCULAR | Status: AC
  Administered 2023-11-29: 25 mg via INTRAMUSCULAR

## 2023-11-29 NOTE — Telephone Encounter (Signed)
 Pt is here with her ride for the Headache Cocktail. Thanks

## 2023-12-04 ENCOUNTER — Other Ambulatory Visit: Payer: Self-pay | Admitting: Neurology

## 2023-12-04 DIAGNOSIS — G43709 Chronic migraine without aura, not intractable, without status migrainosus: Secondary | ICD-10-CM

## 2023-12-04 MED ORDER — METHYLPREDNISOLONE 4 MG PO TBPK: ORAL_TABLET | ORAL | 0 refills | Status: DC

## 2023-12-06 ENCOUNTER — Ambulatory Visit: Admitting: Neurology

## 2023-12-13 NOTE — Progress Notes (Deleted)
 NEUROLOGY FOLLOW UP OFFICE NOTE  [name] [id number]  Subjective:  Toni Frank is a 48 y.o. 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  To briefly review: 10/25/23: Patient's headaches started during puberty and were concentrated around her cycle. She started birth control and her headaches went away. When she stopped taking birth control, the headaches returned. She had been given Fioricet by OB/GYN that helped. She was told these were migraines.   She describes the headache as on the right or left or even a band around head. It can be a stabbing sensation through her eyes to head. It can be pulsating. She endorses associated photophobia, nausea, and vomiting. Her normal headache last 2-3 days. She gets 1 severe headache a month. She will get a headache that lasts 3-4 hours about 3-4 times per week for a total of ~15 headaches per month.    She does not have significant neck pain.   Patient went to ED for headaches on 09/09/23 that had been present for weeks. She got 2 migraine cocktails that eventually helped some. She mentions she is sensitive to steroids.   Currently, when she gets a headache, she will see what happens. If she feels nauseated, she will know it is a worse headache. She will then take Fioricet (4-5 per month) which will take the edge off. She will take Sumatriptan  50 mg daily if Fioricet is not helping. This can resolve her headache if taken at the right time. She has zofran  ODT for nausea.   Smoker: no OCP/hormone use: none Caffiene use: 1 cup of coffee in morning EtOH use: no Restrictive diet: no Family history of neurologic disease including headaches: son has migraines; mother with migraines  Most recent Assessment and Plan (10/25/23): Toni Frank is a 48 y.o. female who presents for  evaluation of headaches. 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 Her neurological examination is normal today. Available diagnostic data is significant for CT head which was normal (09/2023). Patient's headaches sound most consistent with previous diagnosis of migraine, likely without aura. She is having 1 severe headache but 4 other headaches she discounts per week. It is likely she is having around 15 headaches per month currently. She would benefit from a preventative medication. Her son took topamax  previously, so she is comfortable trying this. For rescue, Fioricet is often associated with medication overuse and rebound headaches and does not seem to work well for patient, so I will stop this. I will increase her Sumatriptan  to the max dose for rescue (100 mg).   PLAN: -Blood work: B12, TSH -For migraines: Migraine prevention:  Start Topamax  25 mg daily Migraine rescue:  Stop Fioricet. Increase Sumatriptan  100 mg as needed at headache onset, can repeat in 2 hours if needed. Zofran  ODT as needed for nausea Limit use of pain relievers to no more than 2 days out of week to prevent risk of rebound or medication-overuse headache. Keep headache diary  Since their last visit: Labs were normal.  Patient messaged on 11/27/23 with headache that had be fluctuating but constant since 11/06/23. Imitrex  was taking the edge off but headache would return. I increased her Topamax  to 50 mg daily on 11/27/23 and changed her sumatriptan  to rizatriptan.She came into the office for  a headache cocktail at my recommendation on 11/29/23. This did not resolve her headache though. I recommended she try Fioricet, worried we had withdrawn this too quickly. This would help, but the headache would just return. She was worried about taking steroids but decided to try it on 12/04/23. She had less pain while on steroids but the pain  just returned when the medrol  dose pak finished on 12/11/23. Rizatriptan lessens the pain but headaches return to full force after a few hours.   She is here today to discuss next steps.***  MEDICATIONS:  Outpatient Encounter Medications as of 12/15/2023  Medication Sig   methylPREDNISolone  (MEDROL  DOSEPAK) 4 MG TBPK tablet Take 6 tablets for 1 day, then 5 tablets for 1 day, then 4 tablets for 1 day, then 3 tablets for 1 day, then 2 tablets for 1 day, then 1 tablet for 1 day, then STOP   cetirizine (ZYRTEC) 10 MG tablet Take 10 mg by mouth daily. (Patient taking differently: Take 10 mg by mouth as needed.)   EPINEPHrine  (EPIPEN ) 0.3 mg/0.3 mL SOAJ injection Inject 0.3 mLs (0.3 mg total) into the muscle once.   Multiple Vitamin (MULTIVITAMIN) tablet Take 1 tablet by mouth daily.   NON FORMULARY Take 1 tablet by mouth. Mag- zinc one daily   ondansetron  (ZOFRAN -ODT) 4 MG disintegrating tablet Take 1 tablet (4 mg total) by mouth every 8 (eight) hours as needed.   rizatriptan (MAXALT) 10 MG tablet Take 1 tablet (10 mg total) by mouth as needed for migraine. May repeat in 2 hours if needed   topiramate  (TOPAMAX ) 25 MG tablet Take 2 tablets (50 mg total) by mouth daily.   zolpidem  (AMBIEN ) 10 MG tablet Take 10 mg by mouth at bedtime. (Patient not taking: Reported on 10/25/2023)   [DISCONTINUED] levothyroxine (SYNTHROID) 50 MCG tablet Take 50 mcg by mouth daily.   Facility-Administered Encounter Medications as of 12/15/2023  Medication   EPINEPHrine  (ADRENALIN ) injection 0.2 mg    PAST MEDICAL HISTORY: Past Medical History:  Diagnosis Date   ADD (attention deficit disorder with hyperactivity)    Allergy    acute - cats   Asthma    IBS (irritable bowel syndrome)    Irregular periods    Migraines    PVC (premature ventricular contraction)     PAST SURGICAL HISTORY: Past Surgical History:  Procedure Laterality Date   GYNECOLOGIC CRYOSURGERY     SPINE SURGERY     TUBAL LIGATION       ALLERGIES: Allergies  Allergen Reactions   Cortisone Other (See Comments)    Gained cushings    Sulfonamide Derivatives     REACTION: hives    FAMILY HISTORY: Family History  Problem Relation Age of Onset   Obesity Father    Hypertension Father    Cervical cancer Maternal Grandmother    Heart disease Other    Cancer Other        liver   Colon polyps Other    GI problems Other        IBS    SOCIAL HISTORY: Social History   Tobacco Use   Smoking status: Former   Smokeless tobacco: Never  Vaping Use   Vaping status: Never Used  Substance Use Topics   Alcohol use: No   Drug use: No   Social History   Social History Narrative   Are you right handed or left handed? right   Are you currently employed ?    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   Who lives with you? family   What type of home do you live in: 1 story or 2 story? one    Caffiene coffee in am 1.      Objective:  Vital Signs:  There were no vitals taken for this visit.  ***  Labs and Imaging review: New results: 10/25/23: B12: 367 TSH wnl  Previously reviewed results: 09/09/23: BMP significant for glucose 115 CBC w/ diff significant for WBC of 11.3   TSH (01/30/19) wnl   Imaging/Procedures: CT head wo contrast (09/09/23): Brain: No evidence of acute infarction, hemorrhage, hydrocephalus, extra-axial collection or mass lesion/mass effect.   Vascular: No hyperdense vessel or unexpected calcification.   Skull: Normal. Negative for fracture or focal lesion.   Sinuses/Orbits: No acute finding.   Other: None.   IMPRESSION: No acute intracranial pathology.  Assessment/Plan:  This is Avelina DELENA Mace, a 48 y.o. female with: *** go up on topamax ? Add nortriptyline or propranolol?  Plan: ***  Return to clinic in ***currently has follow up scheduled for 03/23/23 at 3:30 pm  Total time spent reviewing records, interview, history/exam, documentation,  and coordination of care on day of encounter:  *** min  Venetia Potters, MD

## 2023-12-14 ENCOUNTER — Ambulatory Visit: Admitting: Neurology

## 2023-12-14 ENCOUNTER — Encounter: Payer: Self-pay | Admitting: Neurology

## 2023-12-14 ENCOUNTER — Telehealth: Payer: Self-pay

## 2023-12-14 VITALS — BP 116/74 | HR 74 | Wt 152.0 lb

## 2023-12-14 DIAGNOSIS — G43709 Chronic migraine without aura, not intractable, without status migrainosus: Secondary | ICD-10-CM

## 2023-12-14 MED ORDER — NORTRIPTYLINE HCL 10 MG PO CAPS: 20.0000 mg | ORAL_CAPSULE | Freq: Every day | ORAL | 5 refills | Status: AC

## 2023-12-14 MED ORDER — UBRELVY 100 MG PO TABS: 100.0000 mg | ORAL_TABLET | ORAL | 5 refills | Status: AC | PRN

## 2023-12-14 NOTE — Progress Notes (Signed)
 NEUROLOGY FOLLOW UP OFFICE NOTE  Toni Frank [id number]  Subjective:  Toni Frank is a 48 y.o. 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  To briefly review: 10/25/23: Patient's headaches started during puberty and were concentrated around her cycle. She started birth control and her headaches went away. When she stopped taking birth control, the headaches returned. She had been given Fioricet by OB/GYN that helped. She was told these were migraines.   She describes the headache as on the right or left or even a band around head. It can be a stabbing sensation through her eyes to head. It can be pulsating. She endorses associated photophobia, nausea, and vomiting. Her normal headache last 2-3 days. She gets 1 severe headache a month. She will get a headache that lasts 3-4 hours about 3-4 times per week for a total of ~15 headaches per month.    She does not have significant neck pain.   Patient went to ED for headaches on 09/09/23 that had been present for weeks. She got 2 migraine cocktails that eventually helped some. She mentions she is sensitive to steroids.   Currently, when she gets a headache, she will see what happens. If she feels nauseated, she will know it is a worse headache. She will then take Fioricet (4-5 per month) which will take the edge off. She will take Sumatriptan  50 mg daily if Fioricet is not helping. This can resolve her headache if taken at the right time. She has zofran  ODT for nausea.   Smoker: no OCP/hormone use: none Caffiene use: 1 cup of coffee in morning EtOH use: no Restrictive diet: no Family history of neurologic disease including headaches: son has migraines; mother with migraines  Most recent Assessment and Plan (10/25/23): Toni Frank is a 48 y.o. female who presents for  evaluation of headaches. 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 Her neurological examination is normal today. Available diagnostic data is significant for CT head which was normal (09/2023). Patient's headaches sound most consistent with previous diagnosis of migraine, likely without aura. She is having 1 severe headache but 4 other headaches she discounts per week. It is likely she is having around 15 headaches per month currently. She would benefit from a preventative medication. Her son took topamax  previously, so she is comfortable trying this. For rescue, Fioricet is often associated with medication overuse and rebound headaches and does not seem to work well for patient, so I will stop this. I will increase her Sumatriptan  to the max dose for rescue (100 mg).   PLAN: -Blood work: B12, TSH -For migraines: Migraine prevention:  Start Topamax  25 mg daily Migraine rescue:  Stop Fioricet. Increase Sumatriptan  100 mg as needed at headache onset, can repeat in 2 hours if needed. Zofran  ODT as needed for nausea Limit use of pain relievers to no more than 2 days out of week to prevent risk of rebound or medication-overuse headache. Keep headache diary  Since their last visit: Labs were normal.  Patient messaged on 11/27/23 with headache that had be fluctuating but constant since 11/06/23. Imitrex  was taking the edge off but headache would return. I increased her Topamax  to 50 mg daily on 11/27/23 and changed her sumatriptan  to rizatriptan.She came into the office for  a headache cocktail at my recommendation on 11/29/23. This did not resolve her headache though. I recommended she try Fioricet, worried we had withdrawn this too quickly. This would help, but the headache would just return. She was worried about taking steroids but decided to try it on 12/04/23. She had less pain while on steroids (still present)  but the pain just returned when the medrol  dose pak finished on 12/11/23. Rizatriptan lessens the pain but headaches return to full force after a few hours. Fioricet works about the same as sumatriptan  and rizatriptan. They will work for a few hours then headaches will return. She functions better on Fioricet than the triptans. She does not feel like she functions and will not drive on triptans.  She is here today to discuss next steps. She has a headache today that started earlier today. She woke up with pressure, but not an intense headache. Her current pain is 8/10.  MEDICATIONS:  Outpatient Encounter Medications as of 12/14/2023  Medication Sig   cetirizine (ZYRTEC) 10 MG tablet Take 10 mg by mouth daily.   EPINEPHrine  (EPIPEN ) 0.3 mg/0.3 mL SOAJ injection Inject 0.3 mLs (0.3 mg total) into the muscle once.   methylPREDNISolone  (MEDROL  DOSEPAK) 4 MG TBPK tablet Take 6 tablets for 1 day, then 5 tablets for 1 day, then 4 tablets for 1 day, then 3 tablets for 1 day, then 2 tablets for 1 day, then 1 tablet for 1 day, then STOP (Patient not taking: Reported on 12/14/2023)   ondansetron  (ZOFRAN -ODT) 4 MG disintegrating tablet Take 1 tablet (4 mg total) by mouth every 8 (eight) hours as needed.   rizatriptan (MAXALT) 10 MG tablet Take 1 tablet (10 mg total) by mouth as needed for migraine. May repeat in 2 hours if needed   topiramate  (TOPAMAX ) 25 MG tablet Take 2 tablets (50 mg total) by mouth daily.   Multiple Vitamin (MULTIVITAMIN) tablet Take 1 tablet by mouth daily. (Patient not taking: Reported on 12/14/2023)   NON FORMULARY Take 1 tablet by mouth. Mag- zinc one daily (Patient not taking: Reported on 12/14/2023)   zolpidem  (AMBIEN ) 10 MG tablet Take 10 mg by mouth at bedtime. (Patient not taking: Reported on 10/25/2023)   [DISCONTINUED] levothyroxine (SYNTHROID) 50 MCG tablet Take 50 mcg by mouth daily.   Facility-Administered Encounter Medications as of 12/14/2023  Medication   EPINEPHrine   (ADRENALIN ) injection 0.2 mg    PAST MEDICAL HISTORY: Past Medical History:  Diagnosis Date   ADD (attention deficit disorder with hyperactivity)    Allergy    acute - cats   Asthma    IBS (irritable bowel syndrome)    Irregular periods    Migraines    PVC (premature ventricular contraction)     PAST SURGICAL HISTORY: Past Surgical History:  Procedure Laterality Date   GYNECOLOGIC CRYOSURGERY     SPINE SURGERY     TUBAL LIGATION      ALLERGIES: Allergies  Allergen Reactions   Cortisone Other (See Comments)    Gained cushings    Sulfonamide Derivatives     REACTION: hives    FAMILY HISTORY: Family History  Problem Relation Age of Onset   Obesity Father    Hypertension Father    Cervical cancer Maternal Grandmother    Heart disease Other    Cancer Other        liver   Colon polyps Other    GI problems Other        IBS    SOCIAL HISTORY:  Social History   Tobacco Use   Smoking status: Former   Smokeless tobacco: Never  [employer]   Vaping status: Never Used  Substance Use Topics   Alcohol use: No   Drug use: No   Social History   Social History Narrative   Are you right handed or left handed? right   Are you currently employed ?    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   Who lives with you? family   What type of home do you live in: 1 story or 2 story? one    Caffiene coffee in am 1.      Objective:  Vital Signs:  BP 116/74   Pulse 74   Wt 152 lb (68.9 kg)   SpO2 100%   BMI 26.09 kg/m   General: No acute distress.  Patient appears well-groomed.   Head:  Normocephalic/atraumatic Eyes:  fundi examined, disc margins clear, no obvious papilledema Neck: supple, no paraspinal tenderness, full range of motion Heart: regular rate and rhythm Lungs: Clear to auscultation bilaterally. Vascular: No carotid bruits.  Neurological Exam: Mental status: alert and oriented, speech fluent and not dysarthric, language  intact.  Cranial nerves: CN I: not tested CN II: pupils equal, round and reactive to light, visual fields intact CN III, IV, VI:  full range of motion, no nystagmus, no ptosis CN V: facial sensation intact. CN VII: upper and lower face symmetric CN VIII: hearing intact CN IX, X: uvula midline CN XI: sternocleidomastoid and trapezius muscles intact CN XII: tongue midline  Bulk & Tone: normal Motor:  muscle strength 5/5 throughout Deep Tendon Reflexes:  2+ throughout.   Sensation:  Light touch sensation intact. Finger to nose testing:  Without dysmetria.     Gait:  Normal station and stride.  Romberg negative.   Labs and Imaging review: New results: 10/25/23: B12: 367 TSH wnl  Previously reviewed results: 09/09/23: BMP significant for glucose 115 CBC w/ diff significant for WBC of 11.3   TSH (01/30/19) wnl   Imaging/Procedures: CT head wo contrast (09/09/23): Brain: No evidence of acute infarction, hemorrhage, hydrocephalus, extra-axial collection or mass lesion/mass effect.   Vascular: No hyperdense vessel or unexpected calcification.   Skull: Normal. Negative for fracture or focal lesion.   Sinuses/Orbits: No acute finding.   Other: None.   IMPRESSION: No acute intracranial pathology.  Assessment/Plan:  This is Toni Frank, a 48 y.o. female with: Chronic migraine without aura - she was having about 15 headaches a month at initial visit on 10/25/23. She has had almost daily headaches since late 10/2023 despite increasing topamax . She does not respond long to Fioricet, sumatriptan , or rizatriptan. She may also have contributions from medication overuse/rebound headaches.  Plan: Migraine prevention:  Stop topamax . Start nortriptyline 10 mg at bedtime for 1 week, then increase to 20 mg thereafter Migraine rescue:  Stop rizatriptan. Will start Ubrelvy 100 mg as needed at headache onset Limit use of pain relievers to no more than 2 days out of week to prevent risk  of rebound or medication-overuse headache. Keep headache diary   Return to clinic as currently scheduled for 03/23/23 at 3:30 pm  Total time spent reviewing records, interview, history/exam, documentation, and coordination of care on day of encounter:  35 min  Venetia Potters, MD

## 2023-12-14 NOTE — Patient Instructions (Signed)
 Migraine prevention:  Stop topamax . Start nortriptyline 10 mg at bedtime for 1 week, then increase to 20 mg thereafter. Please let me know how this is working in the next few weeks. Migraine rescue:  Stop rizatriptan. Will start Ubrelvy 100 mg as needed at headache onset Limit use of pain relievers to no more than 2 days out of week to prevent risk of rebound or medication-overuse headache. Keep headache diary   Return to clinic as currently scheduled for 03/23/23 at 3:30 pm  The physicians and staff at [facility] are committed to providing excellent care. You may receive a survey requesting feedback about your experience at our office. We strive to receive very good responses to the survey questions. If you feel that your experience would prevent you from giving the office a very good  response, please contact our office to try to remedy the situation. We may be reached at [phone]. Thank you for taking the time out of your busy day to complete the survey.  Venetia Potters, MD [facility]

## 2023-12-15 ENCOUNTER — Telehealth: Payer: Self-pay | Admitting: Pharmacy Technician

## 2023-12-15 ENCOUNTER — Ambulatory Visit: Admitting: Neurology

## 2023-12-15 ENCOUNTER — Telehealth: Payer: Self-pay

## 2023-12-15 ENCOUNTER — Other Ambulatory Visit (HOSPITAL_COMMUNITY): Payer: Self-pay

## 2023-12-15 NOTE — Telephone Encounter (Signed)
 Pharmacy Patient Advocate Encounter   Received notification from Pt Calls Messages that prior authorization for UBRELVY 100MG  is required/requested.   Insurance verification completed.   The patient is insured through [employer].   Per test claim: PA required; PA submitted to above mentioned insurance via Prompt PA Key/confirmation #/[id number] Status is pending

## 2023-12-15 NOTE — Telephone Encounter (Signed)
 [name]

## 2023-12-15 NOTE — Telephone Encounter (Signed)
 12/14/23 Medication Samples have been provided to the patient.  Drug name: Regino       Strength: 100 mg        Qty:  5 tabs  LOT: [id number]  Exp.Date: 05/2026  Dosing instructions: Take one tab for on set of migraine and one tab 2 hours later if needed. No more than 2 tabs in 24 hours.   The patient has been instructed regarding the correct time, dose, and frequency of taking this medication, including desired effects and most common side effects.   Toni Frank Solo 8:47 AM 12/15/2023

## 2023-12-15 NOTE — Telephone Encounter (Signed)
 PA has been submitted, and telephone encounter has been created. Please see telephone encounter dated 11.7.25.

## 2023-12-18 NOTE — Telephone Encounter (Signed)
 Medication Samples have been provided to the patient.  Drug name: Holland       Strength: 100 mg        Qty: 4Boxes  LOT: [id number]  Exp.Date: 4/28  Dosing instructions: One tab by mouth for on set of migraine 2nd tab 2 hours later if migraine continues. Only 2 tabs in 24 hours.   The patient has been instructed regarding the correct time, dose, and frequency of taking this medication, including desired effects and most common side effects.   Toni Frank Solo 10:56 AM 12/18/2023   Samples are in front office to be picked up by patient.

## 2023-12-19 ENCOUNTER — Encounter: Payer: Self-pay | Admitting: Neurology

## 2023-12-19 ENCOUNTER — Other Ambulatory Visit (HOSPITAL_COMMUNITY): Payer: Self-pay

## 2023-12-19 NOTE — Telephone Encounter (Signed)
 Pharmacy Patient Advocate Encounter  Received notification from [facility] that Prior Authorization for UBRELVY 100MG  has been APPROVED from 11.7.25 to 11.6.26. Ran test claim, Copay is $0. This test claim was processed through [facility]- copay amounts may vary at other pharmacies due to pharmacy/plan contracts, or as the patient moves through the different stages of their insurance plan.   PA #/Case ID/Reference #: [id number]

## 2023-12-19 NOTE — Telephone Encounter (Signed)
 Patient picked up samples

## 2024-03-14 NOTE — Progress Notes (Unsigned)
 "  NEUROLOGY FOLLOW UP OFFICE NOTE  ALESHKA CORNEY [id number]  Subjective:  Toni Frank is a 49 y.o. 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  To briefly review: 10/25/23: Patient's headaches started during puberty and were concentrated around her cycle. She started birth control and her headaches went away. When she stopped taking birth control, the headaches returned. She had been given Fioricet by OB/GYN that helped. She was told these were migraines.   She describes the headache as on the right or left or even a band around head. It can be a stabbing sensation through her eyes to head. It can be pulsating. She endorses associated photophobia, nausea, and vomiting. Her normal headache last 2-3 days. She gets 1 severe headache a month. She will get a headache that lasts 3-4 hours about 3-4 times per week for a total of ~15 headaches per month.    She does not have significant neck pain.   Patient went to ED for headaches on 09/09/23 that had been present for weeks. She got 2 migraine cocktails that eventually helped some. She mentions she is sensitive to steroids.   Currently, when she gets a headache, she will see what happens. If she feels nauseated, she will know it is a worse headache. She will then take Fioricet (4-5 per month) which will take the edge off. She will take Sumatriptan  50 mg daily if Fioricet is not helping. This can resolve her headache if taken at the right time. She has zofran  ODT for nausea.   Smoker: no OCP/hormone use: none Caffiene use: 1 cup of coffee in morning EtOH use: no Restrictive diet: no Family history of neurologic disease including headaches: son has migraines; mother with migraines  I started Topamax  25 mg daily and Sumatriptan  100 mg PRN with zofran  PRN and stopped Fioricet on  12/14/23.  12/14/23: Labs were normal.   Patient messaged on 11/27/23 with headache that had be fluctuating but constant since 11/06/23. Imitrex  was taking the edge off but headache would return. I increased her Topamax  to 50 mg daily on 11/27/23 and changed her sumatriptan  to rizatriptan .She came into the office for a headache cocktail at my recommendation on 11/29/23. This did not resolve her headache though. I recommended she try Fioricet, worried we had withdrawn this too quickly. This would help, but the headache would just return. She was worried about taking steroids but decided to try it on 12/04/23. She had less pain while on steroids (still present) but the pain just returned when the medrol  dose pak finished on 12/11/23. Rizatriptan  lessens the pain but headaches return to full force after a few hours. Fioricet works about the same as sumatriptan  and rizatriptan . They will work for a few hours then headaches will return. She functions better on Fioricet than the triptans. She does not feel like she functions and will not drive on triptans.   She is here today to discuss next steps. She has a headache today that started earlier today. She woke up with pressure, but not an intense headache. Her current pain is 8/10.  Most recent Assessment and Plan (12/14/23): This is CIENNA DUMAIS, a 49 y.o. female with: Chronic migraine without aura - she was having about 15 headaches a month at initial visit on 10/25/23. She has had almost daily headaches since late 10/2023 despite increasing  topamax . She does not respond long to Fioricet, sumatriptan , or rizatriptan . She may also have contributions from medication overuse/rebound headaches.   Plan: Migraine prevention:  Stop topamax . Start nortriptyline  10 mg at bedtime for 1 week, then increase to 20 mg thereafter Migraine rescue:  Stop rizatriptan . Will start Ubrelvy  100 mg as needed at headache onset Limit use of pain relievers to no more than 2 days  out of week to prevent risk of rebound or medication-overuse headache. Keep headache diary  Since their last visit: Ubrelvy  was approved from 12/15/23 to 12/13/24.   Headaches?  Current medications:  Side effects?  MEDICATIONS:  Outpatient Encounter Medications as of 03/22/2024  Medication Sig   cetirizine (ZYRTEC) 10 MG tablet Take 10 mg by mouth daily.   EPINEPHrine  (EPIPEN ) 0.3 mg/0.3 mL SOAJ injection Inject 0.3 mLs (0.3 mg total) into the muscle once.   nortriptyline  (PAMELOR ) 10 MG capsule Take 2 capsules (20 mg total) by mouth at bedtime. Take 1 capsule (10 mg) at bedtime for 1 week, then increase to 2 capsules (20 mg) at bedtime thereafter   ondansetron  (ZOFRAN -ODT) 4 MG disintegrating tablet Take 1 tablet (4 mg total) by mouth every 8 (eight) hours as needed.   Ubrogepant  (UBRELVY ) 100 MG TABS Take 1 tablet (100 mg total) by mouth as needed.   zolpidem  (AMBIEN ) 10 MG tablet Take 10 mg by mouth at bedtime. (Patient not taking: Reported on 12/14/2023)   [DISCONTINUED] levothyroxine (SYNTHROID) 50 MCG tablet Take 50 mcg by mouth daily.   Facility-Administered Encounter Medications as of 03/22/2024  Medication   EPINEPHrine  (ADRENALIN ) injection 0.2 mg    PAST MEDICAL HISTORY: Past Medical History:  Diagnosis Date   ADD (attention deficit disorder with hyperactivity)    Allergy    acute - cats   Asthma    IBS (irritable bowel syndrome)    Irregular periods    Migraines    PVC (premature ventricular contraction)     PAST SURGICAL HISTORY: Past Surgical History:  Procedure Laterality Date   GYNECOLOGIC CRYOSURGERY     SPINE SURGERY     TUBAL LIGATION      ALLERGIES: Allergies[1]  FAMILY HISTORY: Family History  Problem Relation Age of Onset   Obesity Father    Hypertension Father    Cervical cancer Maternal Grandmother    Heart disease Other    Cancer Other        liver   Colon polyps Other    GI problems Other        IBS    SOCIAL HISTORY: Social  History[2] Social History   Social History Narrative   Are you right handed or left handed? right   Are you currently employed ?    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   Who lives with you? family   What type of home do you live in: 1 story or 2 story? one    Caffiene coffee in am 1.      Objective:  Vital Signs:  There were no vitals taken for this visit.  ***  Labs and Imaging review: No new results***  Previously reviewed results: 10/25/23: B12: 367 TSH wnl  09/09/23: BMP significant for glucose 115 CBC w/ diff significant for WBC of 11.3   TSH (01/30/19) wnl   Imaging/Procedures: CT head wo contrast (09/09/23): Brain: No evidence of acute infarction, hemorrhage, hydrocephalus, extra-axial collection or mass lesion/mass effect.   Vascular: No hyperdense vessel or  unexpected calcification.   Skull: Normal. Negative for fracture or focal lesion.   Sinuses/Orbits: No acute finding.   Other: None.   IMPRESSION: No acute intracranial pathology.  Assessment/Plan:  This is Avelina DELENA Mace, a 49 y.o. female with: ***   Plan: ***  Return to clinic in ***  Total time spent reviewing records, interview, history/exam, documentation, and coordination of care on day of encounter:  *** min  Venetia Potters, MD    [1]  Allergies Allergen Reactions   Cortisone Other (See Comments)    Gained cushings    Sulfonamide Derivatives     REACTION: hives  [2]  Social History Tobacco Use   Smoking status: Former   Smokeless tobacco: Never  Vaping Use   Vaping status: Never Used  Substance Use Topics   Alcohol use: No   Drug use: No   "

## 2024-03-22 ENCOUNTER — Ambulatory Visit: Admitting: Neurology
